# Patient Record
Sex: Female | Born: 1985 | Race: White | Hispanic: No | Marital: Married | Smoking: Never smoker
Health system: Southern US, Community
[De-identification: ages and names within clinical notes are randomized; demographics above are authoritative.]

## PROBLEM LIST (undated history)

## (undated) DIAGNOSIS — K219 Gastro-esophageal reflux disease without esophagitis: Secondary | ICD-10-CM

## (undated) DIAGNOSIS — D649 Anemia, unspecified: Secondary | ICD-10-CM

## (undated) DIAGNOSIS — R51 Headache: Secondary | ICD-10-CM

## (undated) DIAGNOSIS — J45909 Unspecified asthma, uncomplicated: Secondary | ICD-10-CM

## (undated) DIAGNOSIS — M1990 Unspecified osteoarthritis, unspecified site: Secondary | ICD-10-CM

## (undated) DIAGNOSIS — E079 Disorder of thyroid, unspecified: Secondary | ICD-10-CM

## (undated) DIAGNOSIS — Z8489 Family history of other specified conditions: Secondary | ICD-10-CM

## (undated) DIAGNOSIS — R011 Cardiac murmur, unspecified: Secondary | ICD-10-CM

## (undated) DIAGNOSIS — R002 Palpitations: Secondary | ICD-10-CM

## (undated) DIAGNOSIS — M8430XA Stress fracture, unspecified site, initial encounter for fracture: Secondary | ICD-10-CM

## (undated) DIAGNOSIS — Z87442 Personal history of urinary calculi: Secondary | ICD-10-CM

## (undated) HISTORY — PX: OTHER SURGICAL HISTORY: SHX169

## (undated) HISTORY — DX: Headache: R51

## (undated) HISTORY — DX: Anemia, unspecified: D64.9

## (undated) HISTORY — DX: Gastro-esophageal reflux disease without esophagitis: K21.9

## (undated) HISTORY — DX: Disorder of thyroid, unspecified: E07.9

## (undated) HISTORY — PX: ENDOVENOUS ABLATION SAPHENOUS VEIN W/ LASER: SUR449

## (undated) HISTORY — DX: Morbid (severe) obesity due to excess calories: E66.01

## (undated) HISTORY — DX: Palpitations: R00.2

## (undated) HISTORY — DX: Stress fracture, unspecified site, initial encounter for fracture: M84.30XA

---
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
## 2004-02-20 ENCOUNTER — Ambulatory Visit: Payer: Self-pay | Admitting: Internal Medicine

## 2004-02-23 ENCOUNTER — Emergency Department (HOSPITAL_COMMUNITY): Admission: EM | Admit: 2004-02-23 | Discharge: 2004-02-24 | Payer: Self-pay | Admitting: Emergency Medicine

## 2004-02-24 ENCOUNTER — Ambulatory Visit: Payer: Self-pay | Admitting: Internal Medicine

## 2004-05-27 ENCOUNTER — Ambulatory Visit: Payer: Self-pay | Admitting: Internal Medicine

## 2004-11-09 ENCOUNTER — Ambulatory Visit: Payer: Self-pay | Admitting: Internal Medicine

## 2004-12-02 ENCOUNTER — Ambulatory Visit: Payer: Self-pay | Admitting: Internal Medicine

## 2005-03-24 ENCOUNTER — Ambulatory Visit: Payer: Self-pay | Admitting: Internal Medicine

## 2006-06-15 ENCOUNTER — Ambulatory Visit: Payer: Self-pay | Admitting: Internal Medicine

## 2006-10-05 DIAGNOSIS — G43909 Migraine, unspecified, not intractable, without status migrainosus: Secondary | ICD-10-CM | POA: Insufficient documentation

## 2006-12-04 DIAGNOSIS — M26629 Arthralgia of temporomandibular joint, unspecified side: Secondary | ICD-10-CM | POA: Insufficient documentation

## 2006-12-04 DIAGNOSIS — G4733 Obstructive sleep apnea (adult) (pediatric): Secondary | ICD-10-CM | POA: Insufficient documentation

## 2006-12-04 DIAGNOSIS — E669 Obesity, unspecified: Secondary | ICD-10-CM | POA: Insufficient documentation

## 2006-12-05 ENCOUNTER — Ambulatory Visit: Payer: Self-pay | Admitting: Internal Medicine

## 2006-12-12 ENCOUNTER — Encounter: Payer: Self-pay | Admitting: Internal Medicine

## 2006-12-12 ENCOUNTER — Ambulatory Visit: Payer: Self-pay

## 2007-01-16 ENCOUNTER — Ambulatory Visit: Payer: Self-pay | Admitting: Internal Medicine

## 2007-02-05 ENCOUNTER — Telehealth: Payer: Self-pay | Admitting: Internal Medicine

## 2007-04-18 ENCOUNTER — Ambulatory Visit: Payer: Self-pay | Admitting: Internal Medicine

## 2007-04-19 ENCOUNTER — Ambulatory Visit (HOSPITAL_COMMUNITY): Admission: RE | Admit: 2007-04-19 | Discharge: 2007-04-19 | Payer: Self-pay | Admitting: Internal Medicine

## 2007-04-20 ENCOUNTER — Encounter: Payer: Self-pay | Admitting: Internal Medicine

## 2007-04-20 ENCOUNTER — Telehealth: Payer: Self-pay | Admitting: Internal Medicine

## 2007-05-02 ENCOUNTER — Ambulatory Visit: Payer: Self-pay | Admitting: Internal Medicine

## 2007-05-07 ENCOUNTER — Encounter (INDEPENDENT_AMBULATORY_CARE_PROVIDER_SITE_OTHER): Payer: Self-pay | Admitting: *Deleted

## 2007-06-21 ENCOUNTER — Encounter (INDEPENDENT_AMBULATORY_CARE_PROVIDER_SITE_OTHER): Payer: Self-pay | Admitting: *Deleted

## 2007-10-31 ENCOUNTER — Telehealth: Payer: Self-pay | Admitting: Internal Medicine

## 2008-04-17 ENCOUNTER — Ambulatory Visit: Payer: Self-pay | Admitting: Family Medicine

## 2008-04-17 DIAGNOSIS — D509 Iron deficiency anemia, unspecified: Secondary | ICD-10-CM | POA: Insufficient documentation

## 2008-04-17 DIAGNOSIS — K219 Gastro-esophageal reflux disease without esophagitis: Secondary | ICD-10-CM | POA: Insufficient documentation

## 2008-05-20 ENCOUNTER — Telehealth: Payer: Self-pay | Admitting: Family Medicine

## 2008-05-26 ENCOUNTER — Ambulatory Visit: Payer: Self-pay | Admitting: Family Medicine

## 2008-05-26 ENCOUNTER — Emergency Department (HOSPITAL_COMMUNITY): Admission: EM | Admit: 2008-05-26 | Discharge: 2008-05-26 | Payer: Self-pay | Admitting: Emergency Medicine

## 2008-05-28 ENCOUNTER — Encounter: Payer: Self-pay | Admitting: Family Medicine

## 2008-05-28 ENCOUNTER — Ambulatory Visit (HOSPITAL_COMMUNITY): Admission: RE | Admit: 2008-05-28 | Discharge: 2008-05-28 | Payer: Self-pay | Admitting: Emergency Medicine

## 2008-06-02 ENCOUNTER — Telehealth (INDEPENDENT_AMBULATORY_CARE_PROVIDER_SITE_OTHER): Payer: Self-pay | Admitting: Internal Medicine

## 2008-06-03 ENCOUNTER — Encounter (INDEPENDENT_AMBULATORY_CARE_PROVIDER_SITE_OTHER): Payer: Self-pay | Admitting: *Deleted

## 2008-06-03 ENCOUNTER — Ambulatory Visit: Payer: Self-pay | Admitting: Family Medicine

## 2008-06-03 DIAGNOSIS — M542 Cervicalgia: Secondary | ICD-10-CM | POA: Insufficient documentation

## 2008-09-03 ENCOUNTER — Telehealth: Payer: Self-pay | Admitting: Family Medicine

## 2008-09-04 ENCOUNTER — Ambulatory Visit: Payer: Self-pay | Admitting: Family Medicine

## 2008-09-04 LAB — CONVERTED CEMR LAB: Varicella-Zoster Ab, IgM: <0.91 (ref <0.91)

## 2008-09-16 ENCOUNTER — Ambulatory Visit: Payer: Self-pay | Admitting: Family Medicine

## 2008-09-19 ENCOUNTER — Encounter: Admission: RE | Admit: 2008-09-19 | Discharge: 2008-09-19 | Payer: Self-pay | Admitting: Family Medicine

## 2008-09-21 DIAGNOSIS — S82109A Unspecified fracture of upper end of unspecified tibia, initial encounter for closed fracture: Secondary | ICD-10-CM | POA: Insufficient documentation

## 2008-09-23 ENCOUNTER — Encounter (INDEPENDENT_AMBULATORY_CARE_PROVIDER_SITE_OTHER): Payer: Self-pay | Admitting: *Deleted

## 2008-09-24 ENCOUNTER — Encounter: Payer: Self-pay | Admitting: Family Medicine

## 2008-10-05 ENCOUNTER — Ambulatory Visit: Payer: Self-pay | Admitting: Family Medicine

## 2009-01-12 ENCOUNTER — Ambulatory Visit: Payer: Self-pay | Admitting: Family Medicine

## 2009-01-19 ENCOUNTER — Telehealth: Payer: Self-pay | Admitting: Family Medicine

## 2009-01-29 ENCOUNTER — Telehealth: Payer: Self-pay | Admitting: Family Medicine

## 2009-03-09 ENCOUNTER — Telehealth: Payer: Self-pay | Admitting: Family Medicine

## 2009-03-24 ENCOUNTER — Encounter: Payer: Self-pay | Admitting: Cardiovascular Disease

## 2009-03-24 ENCOUNTER — Emergency Department: Payer: Self-pay | Admitting: Emergency Medicine

## 2009-03-24 LAB — CONVERTED CEMR LAB
BUN: 14 mg/dL
CO2: 24 meq/L
Calcium: 9.7 mg/dL
Chloride: 102 meq/L
Creatinine, Ser: 0.83 mg/dL
Glucose, Bld: 80 mg/dL
Potassium: 4.2 meq/L
Sodium: 138 meq/L
TSH: 5.77 microintl units/mL

## 2009-03-25 ENCOUNTER — Ambulatory Visit: Payer: Self-pay | Admitting: Cardiovascular Disease

## 2009-03-27 ENCOUNTER — Ambulatory Visit: Payer: Self-pay | Admitting: Family Medicine

## 2009-03-27 DIAGNOSIS — I479 Paroxysmal tachycardia, unspecified: Secondary | ICD-10-CM | POA: Insufficient documentation

## 2009-03-27 DIAGNOSIS — F411 Generalized anxiety disorder: Secondary | ICD-10-CM | POA: Insufficient documentation

## 2009-07-06 ENCOUNTER — Telehealth: Payer: Self-pay | Admitting: Family Medicine

## 2009-07-16 ENCOUNTER — Encounter: Payer: Self-pay | Admitting: Family Medicine

## 2009-08-14 ENCOUNTER — Ambulatory Visit: Payer: Self-pay | Admitting: Gastroenterology

## 2009-08-14 ENCOUNTER — Encounter (INDEPENDENT_AMBULATORY_CARE_PROVIDER_SITE_OTHER): Payer: Self-pay | Admitting: *Deleted

## 2009-08-14 DIAGNOSIS — R12 Heartburn: Secondary | ICD-10-CM | POA: Insufficient documentation

## 2009-08-21 ENCOUNTER — Ambulatory Visit: Payer: Self-pay | Admitting: Gastroenterology

## 2009-09-11 ENCOUNTER — Telehealth: Payer: Self-pay | Admitting: Gastroenterology

## 2010-01-06 ENCOUNTER — Ambulatory Visit: Payer: Self-pay | Admitting: Gastroenterology

## 2010-01-06 DIAGNOSIS — R1011 Right upper quadrant pain: Secondary | ICD-10-CM | POA: Insufficient documentation

## 2010-01-13 ENCOUNTER — Ambulatory Visit (HOSPITAL_COMMUNITY)
Admission: RE | Admit: 2010-01-13 | Discharge: 2010-01-13 | Payer: Self-pay | Source: Home / Self Care | Attending: Gastroenterology | Admitting: Gastroenterology

## 2010-02-02 ENCOUNTER — Ambulatory Visit (HOSPITAL_COMMUNITY)
Admission: RE | Admit: 2010-02-02 | Discharge: 2010-02-02 | Payer: Self-pay | Source: Home / Self Care | Attending: Gastroenterology | Admitting: Gastroenterology

## 2010-02-08 ENCOUNTER — Encounter: Payer: Self-pay | Admitting: Emergency Medicine

## 2010-02-14 LAB — CONVERTED CEMR LAB
BUN: 10 mg/dL (ref 6–23)
CO2: 25 meq/L (ref 19–32)
Calcium: 10 mg/dL (ref 8.4–10.5)
Chloride: 105 meq/L (ref 96–112)
Creatinine, Ser: 0.8 mg/dL (ref 0.4–1.2)
Free T4: 0.9 ng/dL (ref 0.6–1.6)
GFR calc Af Amer: 115 mL/min
GFR calc non Af Amer: 95 mL/min
Glucose, Bld: 102 mg/dL — ABNORMAL HIGH (ref 70–99)
Potassium: 3.9 meq/L (ref 3.5–5.1)
Sodium: 140 meq/L (ref 135–145)
T3, Free: 3 pg/mL (ref 2.3–4.2)
TSH: 1.84 microintl units/mL (ref 0.35–5.50)

## 2010-02-16 NOTE — Progress Notes (Signed)
  Phone Note Call from Patient   Caller: Patient Call For: Hannah Beat MD Summary of Call: Patient seen and need antibiotic and now has yeast infection wants something call in Initial call taken by: Benny Lennert CMA Duncan Dull),  January 19, 2009 12:49 PM  Follow-up for Phone Call        Likely yeast infection post antibiotics. Sent in antifungal.  Was this the coirrect pharm? if not correct.  Follow-up by: Kerby Nora MD,  January 19, 2009 1:15 PM    New/Updated Medications: FLUCONAZOLE 150 MG TABS (FLUCONAZOLE) 1 tab by mouth x 1 Prescriptions: FLUCONAZOLE 150 MG TABS (FLUCONAZOLE) 1 tab by mouth x 1  #1 x 0   Entered and Authorized by:   Kerby Nora MD   Signed by:   Kerby Nora MD on 01/19/2009   Method used:   [facility]        [facility] (retail)       [address]       [place], [place]  [address]       Ph: [phone] or [phone]       Fax: [phone]   RxID:   [id number]

## 2010-02-16 NOTE — Progress Notes (Signed)
Summary: wants GI referral  Phone Note Call from Patient   Caller: [name] [name]   [phone] Call For: Hannah Beat MD Summary of Call: Pt is asking for a referral to a GI.  Has had some problems with reflux, getting worse.  No preference where. Initial call taken by: Lowella Petties CMA,  July 06, 2009 11:54 AM  Follow-up for Phone Call        failure multiple ppi's  reasonbale Follow-up by: Hannah Beat MD,  July 06, 2009 12:10 PM

## 2010-02-16 NOTE — Assessment & Plan Note (Signed)
Summary: EMERGENCY ROOM F-UP ON RAPID HEART BEAT   Vital Signs:  Patient profile:   25 year old female Height:      68.75 inches Weight:      268 pounds BMI:     40.01 Temp:     98.3 degrees F oral Pulse rate:   80 / minute Pulse rhythm:   regular BP sitting:   110 / 76  (left arm) Cuff size:   large  Vitals Entered By: Linde Gillis CMA Duncan Dull) (March 27, 2009 3:22 PM) CC: ER follow up rapid heartbeat   History of Present Illness: 25 year old f/u rapid heart rate:  Tuesday, had some rapid heart, would go and skip some beats and going back and forth.    Dr. Windell Hummingbird notes and ER notes reviewed. EKG normal, s/p 48 hour holter 2 years ago that was normal.  She had symptoms lasting almost 4 hours.  Discussing with mom she has had similar sensations like this for a number of years. Some stress at work.   Currently has diazepam to start if sensations resume   Current Problems (verified): 1)  Paroxysmal Tachycardia  (ICD-427.2) 2)  Anxiety State, Unspecified  (ICD-300.00) 3)  Obstructive Sleep Apnea  (ICD-327.23) 4)  Fracture, Tibial Plateau  (ICD-823.00) 5)  Cervicalgia  (ICD-723.1) 6)  Gerd  (ICD-530.81) 7)  Anemia-iron Deficiency  (ICD-280.9) 8)  Sleep Apnea, Obstructive, Mild  (ICD-327.23) 9)  Obesity  (ICD-278.00) 10)  Migraine Headache  (ICD-346.90) 11)  Hx of Temporomandibular Joint Pain  (ICD-524.62)  Allergies (verified): No Known Drug Allergies  Past History:  Past medical, surgical, family and social histories (including risk factors) reviewed, and no changes noted (except as noted below).  Past Medical History: Reviewed history from 09/21/2008 and no changes required. Headache Morbid Obesity Palpitations Anemia-iron deficiency GERD Medial tibial plateau stress fracture, mild medial retinacular sprain and PF lig sprain  Past Surgical History: Reviewed history from 12/04/2006 and no changes required. Status post multiple tubes in ears  Family  History: Reviewed history from 04/17/2008 and no changes required. Alcoholism, Drug Addiction: 0 Colon CA: 0 Ovarian/Uterine CA: 0 Breast CA: 0 Lung CA: 0 Prostate CA: 0 CAD: 0 CVA: other Sudden death < 50: 0 DM: other Mental Illness: 0  Family History Hypertension  Social History: Reviewed history from 04/17/2008 and no changes required. Never Smoked Alcohol use-no Getting married, 07/2008 Occupation:  Works as a Pharmacologist at [employer], [employer]  Review of [employer]:  Denies chills, fatigue, and fever. CV:  Complains of palpitations; denies swelling of feet and swelling of hands. Psych:  stress and anxiety?.  Physical Exam  Additional Exam:  GEN: WDWN, NAD, Non-toxic, A & O x 3 HEENT: Atraumatic, Normocephalic. Neck supple. No masses, No LAD. Ears and Nose: No external deformity. CV: RRR, No M/G/R. No JVD. No thrill. No extra heart sounds. PULM: CTA B, no wheezes, crackles, rhonchi. No retractions. No resp. distress. No accessory muscle use. EXTR: No c/c/e NEURO: Normal gait.  PSYCH: Normally interactive. Conversant. Not depressed or anxious appearing.  Calm demeanor.     Impression & Recommendations:  Problem # 1:  TACHYCARDIA (ICD-785) agree with Dr. Mariah Milling, if any more continued symptoms, [facility] is a good idea.  Problem # 2:  ANXIETY STATE, UNSPECIFIED (ICD-300.00) I wonder if some of these short spells may have an anxiety component - asked her to put ativan under tongue at onset of shorter episode to see if relieves symptoms  Her  updated medication list for this problem includes:    Lorazepam 0.5 Mg Tabs (Lorazepam) .Marland Kitchen... 1 by mouth as directed. as needed for anxiety  Problem # 3:  OBSTRUCTIVE SLEEP APNEA (ICD-327.23) the suggestion for CPAP and sleep consult a good one - patient would need to be willing  Complete Medication List: 1)  Flexeril 10 Mg Tabs (Cyclobenzaprine hcl) .Marland Kitchen.. 1 tab one by mouth three times a day as needed 2)  Ibuprofen  600 Mg Tabs (Ibuprofen) .... One tablet by mouth every 6 to 8 hours as needed headache 3)  Aleve 220 Mg Tabs (Naproxen sodium) .... Take 1 tablet by mouth once a day 4)  Protonix 40 Mg Tbec (Pantoprazole sodium) .Marland Kitchen.. 1 by mouth two times a day (failure prilosec, multiple ppi's) 5)  Vitamin D 1000 Unit Tabs (Cholecalciferol) .Marland Kitchen.. 1 by mouth once daily 6)  Tri-sprintec 0.18/0.215/0.25 Mg-35 Mcg Tabs (Norgestim-eth estrad triphasic) .Marland Kitchen.. 1 by mouth once daily 7)  Cardizem 30 Mg Tabs (Diltiazem hcl) .Marland Kitchen.. 1 tab two times a day as needed for increased heart rate 8)  Lorazepam 0.5 Mg Tabs (Lorazepam) .Marland Kitchen.. 1 by mouth as directed. as needed for anxiety Prescriptions: LORAZEPAM 0.5 MG TABS (LORAZEPAM) 1 by mouth as directed. as needed for anxiety  #10 x 0   Entered and Authorized by:   Hannah Beat MD   Signed by:   Hannah Beat MD on 03/27/2009   Method used:   Print then Give to Patient   RxID:   [id number]   Current Allergies (reviewed today): No known allergies

## 2010-02-16 NOTE — Procedures (Signed)
Summary: Upper Endoscopy  Patient: [name] [name] Note: All result statuses are Final unless otherwise noted.  Tests: (1) Upper Endoscopy (EGD)   EGD Upper Endoscopy       DONE     [facility]     [address]     [place], [place]  [address]           ENDOSCOPY PROCEDURE REPORT           PATIENT:  [name], [name]  MR#:  [id number]     BIRTHDATE:  21-May-1985, 23 yrs. old  GENDER:  female     ENDOSCOPIST:  Rachael Fee, MD     Referred by:  Elpidio Galea. Copland, M.D.     PROCEDURE DATE:  08/21/2009     PROCEDURE:  EGD, diagnostic     ASA CLASS:  Class II     INDICATIONS:  GERD     MEDICATIONS:  Fentanyl 50 mcg IV, Versed 7 mg IV     TOPICAL ANESTHETIC:  Exactacain Spray           DESCRIPTION OF PROCEDURE:   After the risks benefits and     alternatives of the procedure were thoroughly explained, informed     consent was obtained.  The LB GIF-H180 [id number] endoscope was     introduced through the mouth and advanced to the second portion of     the duodenum, without limitations.  The instrument was slowly     withdrawn as the mucosa was fully examined.     <<PROCEDUREIMAGES>>     There was very mild reflux esophagitis above a small (1cm) hiatal     hernia. The examination was otherwise normal (see image5, image4,     image2, and image1).    Retroflexed views revealed no     abnormalities.    The scope was then withdrawn from the patient     and the procedure completed.           COMPLICATIONS:  None           ENDOSCOPIC IMPRESSION:     1) Very mild reflux esophagitis.  Small hiatal hernia will     predispose to GERD.           RECOMMENDATIONS:     Continue on protonix once daily (best taken 20-30 min prior to a     meal).           ______________________________     Rachael Fee, MD           n.     eSIGNED:   Rachael Fee at 08/21/2009 04:09 PM           [name] [name], [phone]  Note: An exclamation mark (!) indicates a result that was not  dispersed into the flowsheet. Document Creation Date: 08/21/2009 4:11 PM _______________________________________________________________________  (1) Order result status: Final Collection or observation date-time: 08/21/2009 16:06 Requested date-time:  Receipt date-time:  Reported date-time:  Referring Physician:   Ordering Physician: Rob Bunting [phone]) Specimen Source:  Source: Launa Grill Order Number: [id number] Lab site:

## 2010-02-16 NOTE — Progress Notes (Signed)
Summary: PHI  PHI   Imported By: Harlon Flor 03/26/2009 10:09:39  _____________________________________________________________________  External Attachment:    Type:   Image     Comment:   External Document

## 2010-02-16 NOTE — Assessment & Plan Note (Signed)
History of Present Illness Visit Type: Initial Consult Primary GI MD: Rob Bunting MD Primary Provider: Hannah Beat, MD Chief Complaint: GERD History of Present Illness:      very pleasant 25 year old woman who is here with her husband today.  who has had pyrosis, she will vomit at least once a week.  Pyrosis 3-4 times a week.  This has been going on for 10 years.  She had barium UGI, pH testing 10 years ago.    She has tried prevacid, protonix...has not tried nexium.  She will take the PPI in AM, then eats 2 hours later.  Was drinking 3-4 sodas a day, has cut back.  She normally eats dinner 4-5 hours before laying down. Symptoms clearly worse at night.  Sometimes she has solid food sdysphagia.  She has gained 20 pounds in the past  year or so.  she will take ibuprofen as needed for migraines.  Takes one alleve a day in AM to help prevent HAs.           Current Medications (verified): 1)  Flexeril 10 Mg  Tabs (Cyclobenzaprine Hcl) .Marland Kitchen.. 1 Tab One By Mouth Three Times A Day As Needed 2)  Ibuprofen 600 Mg  Tabs (Ibuprofen) .... One Tablet By Mouth Every 6 To 8 Hours As Needed Headache 3)  Aleve 220 Mg Tabs (Naproxen Sodium) .... Take 1 Tablet By Mouth Once A Day 4)  Protonix 40 Mg  Tbec (Pantoprazole Sodium) .Marland Kitchen.. 1 By Mouth Two Times A Day (Failure Prilosec, Multiple Ppi's) 5)  Vitamin D 1000 Unit Tabs (Cholecalciferol) .... 2  By Mouth Once Daily 6)  Birth Control .Marland Kitchen.. 1 By Mouth Once Daily 7)  Cardizem 30 Mg Tabs (Diltiazem Hcl) .Marland Kitchen.. 1 Tab Two Times A Day As Needed For Increased Heart Rate 8)  Lorazepam 0.5 Mg Tabs (Lorazepam) .Marland Kitchen.. 1 By Mouth As Directed. As Needed For Anxiety  Allergies (verified): No Known Drug Allergies  Past History:  Past Medical History: Headache Morbid Obesity Palpitations Anemia-iron deficiency GERD Medial tibial plateau stress fracture, mild medial retinacular sprain and PF lig sprain   Past Surgical History: Status post multiple  tubes in ears   Family History: Alcoholism, Drug Addiction: 0 Colon CA: 0 Ovarian/Uterine CA: 0 Breast CA: 0 Lung CA: 0 Prostate CA: 0 CAD: 0 CVA: other Sudden death < 50: 0 DM: other Mental Illness: 0  Family History Hypertension   Social History: Never Smoked Alcohol use-no married Occupation:  Works as a Pharmacologist at [employer], [employer]  Review of Systems       Pertinent positive and negative review of systems were noted in the above HPI and GI specific review of systems.  All other review of systems was otherwise negative.   Vital Signs:  Patient profile:   25 year old female Height:      69 inches Weight:      277.38 pounds BMI:     41.11 Pulse rate:   100 / minute Pulse rhythm:   regular BP sitting:   128 / 90  (left arm) Cuff size:   large  Vitals Entered By: June McMurray CMA Duncan Dull) (August 14, 2009 1:55 PM)  Physical Exam  Additional Exam:  Constitutional: Morbidly obese, otherwise generally well-appearing Psychiatric: alert and oriented times 3 Eyes: extraocular movements intact Mouth: oropharynx moist, no lesions Neck: supple, no lymphadenopathy Cardiovascular: heart regular rate and rythm Lungs: CTA bilaterally Abdomen: soft, non-tender, non-distended, no obvious ascites, no peritoneal signs, normal bowel  sounds Extremities: no lower extremity edema bilaterally Skin: no lesions on visible extremities    Impression & Recommendations:  Problem # 1:  chronic GERD she has had pyrosis for at least 8-10 years. I think we should proceed with EGD at her soonest convenience to screen her for Barrett's esophagus, stricturing, esophagitis. She is taking proton pump inhibitor at the wrong time in relation to eating and so I have recommended she change that. We did discuss that her weight could be contributing to GERD and I recommended she focus on a long-term, very gradual weight reduction with a goal of losing 10-12 pounds per year for the next several  years.  Patient Instructions: 1)  GERD handout given. 2)  You will be scheduled to have an upper endoscopy. 3)  You should change the way you are taking your antiacid medicine (protonix) so that you are taking it 20-30 minutes prior to a decent first and last  meals of the day as that is the way the pill is designed to work most effectively. 4)  Gradual weight loss. 5)  A copy of this information will be sent to Dr. Dallas Schimke. 6)  The medication list was reviewed and reconciled.  All changed / newly prescribed medications were explained.  A complete medication list was provided to the patient / caregiver.  Appended Document: Orders Update/EGD    Clinical Lists Changes  Problems: Added new problem of HEARTBURN (ICD-787.1) Orders: Added new Test order of EGD (EGD) - Signed

## 2010-02-16 NOTE — Letter (Signed)
Summary: EGD Instructions  [facility]  [address] [place], [place] [address]   Phone: [phone]  Fax: [phone]       [name] [name]    12/24/1985    MRN: [id number]       Procedure Day /Date: 08/21/09  THURS     Arrival Time: 3 pm     Procedure Time:4 pm     Location of Procedure:                     [facility] (4th Floor)    PREPARATION FOR ENDOSCOPY   On 08/21/09  THE DAY OF THE PROCEDURE:  1.   No solid foods, milk or milk products are allowed after midnight the night before your procedure.  2.   Do not drink anything colored red or purple.  Avoid juices with pulp.  No orange juice.  3.  You may drink clear liquids until 2 pm, which is 2 hours before your procedure.                                                                                                CLEAR LIQUIDS INCLUDE: Water Jello Ice Popsicles Tea (sugar ok, no milk/cream) Powdered fruit flavored drinks Coffee (sugar ok, no milk/cream) Gatorade Juice: apple, white grape, white cranberry  Lemonade Clear bullion, consomm, broth Carbonated beverages (any kind) Strained chicken noodle soup Hard Candy   MEDICATION INSTRUCTIONS  Unless otherwise instructed, you should take regular prescription medications with a small sip of water as early as possible the morning of your procedure.  Diabetic patients - see separate instructions.             OTHER INSTRUCTIONS  You will need a responsible adult at least 26 years of age to accompany you and drive you home.   This person must remain in the waiting room during your procedure.  Wear loose fitting clothing that is easily removed.  Leave jewelry and other valuables at home.  However, you may wish to bring a book to read or an iPod/MP3 player to listen to music as you wait for your procedure to start.  Remove all body piercing jewelry and leave at home.  Total time from sign-in until discharge is  approximately 2-3 hours.  You should go home directly after your procedure and rest.  You can resume normal activities the day after your procedure.  The day of your procedure you should not:   Drive   Make legal decisions   Operate machinery   Drink alcohol   Return to work  You will receive specific instructions about eating, activities and medications before you leave.    The above instructions have been reviewed and explained to me by   _______________________    I fully understand and can verbalize these instructions _____________________________ Date _________     Appended Document: EGD Instructions instructions corrected should be Fri not Thurs

## 2010-02-16 NOTE — Progress Notes (Signed)
Summary: Rx Ibuprofen  Phone Note Refill Request Call back at [phone] Message [facility]:  [facility] on January 29, 2009 3:26 PM  Refills Requested: Medication #1:  IBUPROFEN 600 MG  TABS one tablet by mouth every 6 to 8 hours as needed headache   Last Refilled: 07/13/2007 Received faxed refill request.  Please advise   Method Requested: Telephone to Pharmacy Initial call taken by: Linde Gillis CMA Duncan Dull),  January 29, 2009 3:27 PM    Prescriptions: IBUPROFEN 600 MG  TABS (IBUPROFEN) one tablet by mouth every 6 to 8 hours as needed headache  #40 x 5   Entered by:   Benny Lennert CMA (AAMA)   Authorized by:   Hannah Beat MD   Signed by:   Benny Lennert CMA (AAMA) on 01/29/2009   Method used:   [facility]        [facility]       [address]       [place], [place]  [address]       Ph: [phone] or [phone]       Fax: [phone]   RxID:   [id number]

## 2010-02-16 NOTE — Assessment & Plan Note (Signed)
Summary: NP6/AMD  Medications Added VITAMIN D 1000 UNIT TABS (CHOLECALCIFEROL) 1 by mouth once daily TRI-SPRINTEC 0.18/0.215/0.25 MG-35 MCG TABS (NORGESTIM-ETH ESTRAD TRIPHASIC) 1 by mouth once daily CARDIZEM 30 MG TABS (DILTIAZEM HCL) 1 tab two times a day as needed for increased heart rate      Allergies Added: NKDA  Visit Type:  New  [facility] Referring Provider:  ER doc Primary Provider:  Hannah Beat MD  CC:  chest pain, ct scan clear, irregular heart beat, and shortness of breath. no edema...  History of Present Illness: very pleasant 25 year old woman, with history of obesity, possible obstructive sleep apnea, GERD, migraine headaches who presents after recent episode of palpitations.  [name] [name] states that on March 8 at 7 PM she developed a very rapid heartbeat. His continued for 3 hours before it resolved. Over the course of the night, she had palpitations though they were not fast. She went to the emergency room in the morning and had blood work and a CT scan for an elevated d-dimer. The CT scan showed no pulmonary embolism, TSH 5.77, negative cardiac enzymes, negative tox screen, normal basic metabolic panel.  She states having a similar episode 2 years ago that resolved without intervention. When she had this episode, she had difficulty breathing.  She also has difficulty breathing if she lays flat on her back though her breathing improves if she lies on her side. Her mother states that she has significant snoring and she had a sleep study 4 years ago that seemed to confirm sleep apnea.  Preventive Screening-Counseling & Management  Alcohol-Tobacco     Alcohol drinks/day: 0     Smoking Status: never  Caffeine-Diet-Exercise     Caffeine use/day: 1 cup     Does Patient Exercise: no  Current Problems (verified): 1)  Vertigo  (ICD-780.4) 2)  Otitis Media, Acute, Right  (ICD-382.9) 3)  Fracture, Tibial Plateau  (ICD-823.00) 4)  Cervicalgia  (ICD-723.1) 5)  Gerd   (ICD-530.81) 6)  Anemia-iron Deficiency  (ICD-280.9) 7)  Headache  (ICD-784.0) 8)  Palpitations  (ICD-785.1) 9)  Sleep Apnea, Obstructive, Mild  (ICD-327.23) 10)  Obesity  (ICD-278.00) 11)  Migraine Headache  (ICD-346.90) 12)  Hx of Temporomandibular Joint Pain  (ICD-524.62)  Current Medications (verified): 1)  Flexeril 10 Mg  Tabs (Cyclobenzaprine Hcl) .Marland Kitchen.. 1 Tab One By Mouth Three Times A Day As Needed 2)  Ibuprofen 600 Mg  Tabs (Ibuprofen) .... One Tablet By Mouth Every 6 To 8 Hours As Needed Headache 3)  Aleve 220 Mg Tabs (Naproxen Sodium) .... Take 1 Tablet By Mouth Once A Day 4)  Protonix 40 Mg  Tbec (Pantoprazole Sodium) .Marland Kitchen.. 1 By Mouth Two Times A Day (Failure Prilosec, Multiple Ppi's) 5)  Vitamin D 1000 Unit Tabs (Cholecalciferol) .Marland Kitchen.. 1 By Mouth Once Daily 6)  Tri-Sprintec 0.18/0.215/0.25 Mg-35 Mcg Tabs (Norgestim-Eth Estrad Triphasic) .Marland Kitchen.. 1 By Mouth Once Daily  Allergies (verified): No Known Drug Allergies  Past History:  Past Medical History: Last updated: 09/21/2008 Headache Morbid Obesity Palpitations Anemia-iron deficiency GERD Medial tibial plateau stress fracture, mild medial retinacular sprain and PF lig sprain  Past Surgical History: Last updated: 12/04/2006 Status post multiple tubes in ears  Family History: Last updated: 04/17/2008 Alcoholism, Drug Addiction: 0 Colon CA: 0 Ovarian/Uterine CA: 0 Breast CA: 0 Lung CA: 0 Prostate CA: 0 CAD: 0 CVA: other Sudden death < 50: 0 DM: other Mental Illness: 0  Family History Hypertension  Social History: Last updated: 04/17/2008 Never Smoked Alcohol  use-no Getting married, 07/2008 Occupation:  Works as a Pharmacologist at [employer], [employer]  Risk Factors: Alcohol Use: 0 (03/25/2009) Caffeine Use: 1 cup (03/25/2009) Exercise: no (03/25/2009)  Risk Factors: Smoking Status: never (03/25/2009)  Social History: Alcohol drinks/day:  0 Caffeine use/day:  1 cup Does Patient Exercise:  no  Review  of Systems  The patient denies anorexia, fever, vision loss, decreased hearing, hoarseness, chest pain, syncope, dyspnea on exertion, peripheral edema, prolonged cough, abdominal pain, incontinence, muscle weakness, difficulty walking, depression, and enlarged lymph nodes.         Tachypalpitations, tacycardia, SOB when sleeping on her back  Vital Signs:  Patient profile:   25 year old female Height:      68.75 inches Weight:      271.25 pounds BMI:     40.49 Pulse rate:   78 / minute Pulse rhythm:   regular BP sitting:   128 / 90  (left arm) Cuff size:   large  Vitals Entered By: [name] [name] (March 25, 2009 11:34 AM)  Physical Exam  General:  obese young in no apparent distress, neck is supple with no JVP or carotid bruits, heart sounds are regular with normal S1-S2 and no murmurs appreciated, lungs are clear to auscultation with no wheezes or rales, abdominal exam is benign, no significant lower extremity edema, neurologic exam is grossly nonfocal, skin is warm and dry, pulses are equal in her upper and lower extremities.    EKG  Procedure date:  03/25/2009  Findings:      normal sinus rhythm with rate 78 beats per minute, no significant ST or T wave changes.  Impression & Recommendations:  Problem # 1:  TACHYCARDIA (ICD-785) 3 hours of tachycardia from 7 PM to 10 PM on March 8. As this was rapid, I'm concerned about SVT, possibly even atrial fibrillation. She states that it was very regular, more consistent with SVT. She had an episode 2 years ago as well. I do not believe that a Holter monitor could identify the rhythm as it happens so infrequently. I have suggested to her that if she starts having more episodes, she should contact our office for an event monitor. We would run this for one month.  I have written a prescription for diltiazem 30 mg to be taken as needed also with repeat if indicated for tachypalpitations. I have taught her various maneuvers such as the  Valsalva maneuver and carotid sinus massage which will might help to possibly break her rhythm if it does occur again. Again have asked her to contact our office if she starts having more episodes for further evaluation. I believe that she can be treated with a "pill in the pocket" approach for now.  Problem # 2:  OBSTRUCTIVE SLEEP APNEA (ICD-327.23) by report, she does have a history of obstructive sleep apnea and this is likely the cause of her shortness of breath when she lays flat on her back at nighttime though feels better when she is on her side. She does wake frequently to go to the bathroom and this may also be due to sleep apnea. She does not have CPAP and does not believe that she needs one and she feels rested during the daytime. She does have significant weight and I have asked her to watch her diet and to decrease her weight in an effort to improve her sleep hygiene.  Patient Instructions: 1)  Your physician recommends that you schedule a follow-up appointment as needed. 2)  Your physician has recommended you make the following change in your medication: diltiazem 30 mg twice a day as needed for increased heart rate Prescriptions: CARDIZEM 30 MG TABS (DILTIAZEM HCL) 1 tab two times a day as needed for increased heart rate  #60 x 6   Entered by:   Charlena Cross, RN, BSN   Authorized by:   Dossie Arbour MD   Signed by:   Charlena Cross, RN, BSN on 03/25/2009   Method used:   [facility]        [facility]       [address]       [place], [place]  [address]       Ph: [phone] or [phone]       Fax: [phone]   RxID:   [id number]

## 2010-02-16 NOTE — Progress Notes (Signed)
Summary: Medicine is not helping   Phone Note Call from Patient Call back at [facility] Phone [phone]   Caller: Mom- [phone] Ann Call For: Dr Christella Hartigan Reason for Call: Talk to Nurse Summary of Call: Medicine is just not working still vomitting after meals. Initial call taken by: Leanor Kail [facility],  September 11, 2009 1:40 PM  Follow-up for Phone Call        left message on machine to call back I advised the mother that I would have to speak directly to her daughter.  Chales Abrahams CMA Duncan Dull)  September 11, 2009 2:09 PM   left message on machine to call back I left a message that if she still needs to speak with someone to call  Follow-up by: Chales Abrahams CMA Duncan Dull),  September 14, 2009 8:45 AM

## 2010-02-16 NOTE — Letter (Signed)
[facility]   Im[facility] By: Lanelle Bal 07/29/2009 13:43:58  _____________________________________________________________________  External Attachment:    Type:   Image     Comment:   External Document

## 2010-02-16 NOTE — Progress Notes (Signed)
Summary: refill request for flexeril  Phone Note Refill Request Message from:  Fax from Pharmacy  Refills Requested: Medication #1:  FLEXERIL 10 MG  TABS 1 tab one by mouth three times a day as needed   Last Refilled: 12/06/2008 [facility] [address]  Initial call taken by: Lowella Petties CMA,  March 09, 2009 12:03 PM    Prescriptions: FLEXERIL 10 MG  TABS (CYCLOBENZAPRINE HCL) 1 tab one by mouth three times a day as needed  #40 x 2   Entered and Authorized by:   Hannah Beat MD   Signed by:   Hannah Beat MD on 03/09/2009   Method used:   [facility]        [facility] [facility]       [address]       [place], [place]  [address]       Ph: [phone] or [phone]       Fax: [phone]   RxID:   [id number]

## 2010-02-18 NOTE — Assessment & Plan Note (Signed)
  Review of gastrointestinal problems: 1. GERD, EGD August, 2011 showed minor reflux esophagitis and a small hiatal hernia.   History of Present Illness Visit Type: Follow-up Visit Primary GI MD: Rob Bunting MD Primary Provider: Hannah Beat, MD Chief Complaint: follow-up EGD/RUQ pain  History of Present Illness:     very pleasant 25 year old woman whom I last saw a few months ago at the time of her upper endoscopy. She is here with her husband today.  who has been having daily RUQ pains, intermittent, sharp can last 2-3 min.  Sometimes associated with nausea.THe pain can radiate to right back.  usually pain is post prandial.   protonix two times a day, helps with acid symptoms usually.  she has gained 6 pounds since her last visitAccording to our scale           Current Medications (verified): 1)  Flexeril 10 Mg  Tabs (Cyclobenzaprine Hcl) .Marland Kitchen.. 1 Tab One By Mouth Three Times A Day As Needed 2)  Ibuprofen 600 Mg  Tabs (Ibuprofen) .... One Tablet By Mouth Every 6 To 8 Hours As Needed Headache 3)  Aleve 220 Mg Tabs (Naproxen Sodium) .... Take 1 Tablet By Mouth Once A Day 4)  Protonix 40 Mg  Tbec (Pantoprazole Sodium) .Marland Kitchen.. 1 By Mouth Two Times A Day (Failure Prilosec, Multiple Ppi's) 5)  Vitamin D 1000 Unit Tabs (Cholecalciferol) .... 2  By Mouth Once Daily 6)  Birth Control .Marland Kitchen.. 1 By Mouth Once Daily 7)  Cardizem 30 Mg Tabs (Diltiazem Hcl) .Marland Kitchen.. 1 Tab Two Times A Day As Needed For Increased Heart Rate 8)  Lorazepam 0.5 Mg Tabs (Lorazepam) .Marland Kitchen.. 1 By Mouth As Directed. As Needed For Anxiety  Allergies (verified): No Known Drug Allergies  Vital Signs:  Patient profile:   25 year old female Height:      69 inches Weight:      283 pounds BMI:     41.94 Pulse rate:   68 / minute Pulse rhythm:   regular BP sitting:   128 / 78  (left arm)  Vitals Entered By: Milford Cage NCMA (January 06, 2010 3:52 PM)  Physical Exam  Additional Exam:  Constitutional: obese, otherwise  generally well appearing Psychiatric: alert and oriented times 3 Abdomen: soft, non-tender, non-distended, normal bowel sounds    Impression & Recommendations:  Problem # 1:  GERD her symptoms seem to be under tolerable control with Protonix twice daily. She will continue this for now.  Problem # 2:  intermittent right upper quadrant pains these sound biliary in nature. We will set her up with abdominal ultrasound and if this is normal would proceed with a HIDA scan to evaluate her gallbladder ejection fraction.  Patient Instructions: 1)  You will be scheduled for an ultrasound to examine your gallbladder. 2)  The medication list was reviewed and reconciled.  All changed / newly prescribed medications were explained.  A complete medication list was provided to the patient / caregiver.  Appended Document: Orders Update/US    Clinical Lists Changes  Problems: Added new problem of RUQ PAIN (ICD-789.01) Orders: Added new Test order of Ultrasound Abdomen (UAS) - Signed

## 2010-03-04 ENCOUNTER — Encounter: Payer: Self-pay | Admitting: Family Medicine

## 2010-03-16 NOTE — Medication Information (Signed)
Summary: Letter Regarding Pantoprazole & GERD/[facility]  Letter Regarding Pantoprazole & GERD/[facility]   Imported By: Lanelle Bal 03/11/2010 14:15:05  _____________________________________________________________________  External Attachment:    Type:   Image     Comment:   External Document

## 2010-06-01 ENCOUNTER — Other Ambulatory Visit: Payer: Self-pay | Admitting: Family Medicine

## 2010-06-04 NOTE — Procedures (Signed)
NAME:  Amber [name], Amber [name] NO.:  [id number]   MEDICAL RECORD NO.:  [id number]          PATIENT TYPE:  OUT   LOCATION:  SLEEP CENTER                 FACILITY[facility]  [facility]   PHYSICIAN:  Marcelyn Bruins, M.D. [facility] DATE OF BIRTH:  1985/06/05   DATE OF STUDY:  12/03/2003                              NOCTURNAL POLYSOMNOGRAM   REFERRING PHYSICIAN:  Dr. Thomos Lemons   INDICATION FOR STUDY:  Hypersomnia with sleep apnea.   EPWORTH SLEEPINESS SCORE:  16.   SLEEP ARCHITECTURE:  The patient had a total sleep time of 347 minutes with  a sleep efficiency of 91%.  There was decreased REM and slow age sleep was  never achieved.  Sleep onset latency was normal and REM latency was  prolonged.   IMPRESSION:  1.  Mild obstructive sleep apnea/hypopnea syndrome with a respiratory      disturbance index of 17 events per hour and desaturation as low at 87%.      The events primarily occurred in the supine position.  2. Moderate      snoring noted throughout the study.  3. No clinically significant      cardiac arrhythmias or periodic leg movements.      KC/MEDQ  D:  12/16/2003 16:52:39  T:  12/17/2003 08:05:59  Job:  [id number]

## 2010-06-18 ENCOUNTER — Other Ambulatory Visit: Payer: Self-pay | Admitting: Family Medicine

## 2010-06-18 NOTE — Telephone Encounter (Signed)
Last refill: 02/2010 Last office visit: 03/28/2010  Will refill once, no additional refills as this is not my pt.

## 2010-06-20 NOTE — Telephone Encounter (Signed)
Patient known well. Very reasonable.

## 2010-08-02 ENCOUNTER — Encounter: Payer: Self-pay | Admitting: Cardiovascular Disease

## 2010-11-01 ENCOUNTER — Other Ambulatory Visit: Payer: Self-pay | Admitting: Family Medicine

## 2010-12-29 ENCOUNTER — Other Ambulatory Visit: Payer: Self-pay | Admitting: Family Medicine

## 2011-02-24 ENCOUNTER — Other Ambulatory Visit: Payer: Self-pay | Admitting: Family Medicine

## 2011-09-20 ENCOUNTER — Other Ambulatory Visit: Payer: Self-pay | Admitting: Family Medicine

## 2012-02-29 ENCOUNTER — Ambulatory Visit (INDEPENDENT_AMBULATORY_CARE_PROVIDER_SITE_OTHER): Payer: Self-pay | Admitting: Family Medicine

## 2012-02-29 ENCOUNTER — Encounter: Payer: Self-pay | Admitting: Family Medicine

## 2012-02-29 VITALS — BP 130/84 | HR 78 | Temp 97.7°F | Ht 69.0 in | Wt 244.0 lb

## 2012-02-29 DIAGNOSIS — M549 Dorsalgia, unspecified: Secondary | ICD-10-CM

## 2012-02-29 LAB — POCT URINE PREGNANCY: Preg Test, Ur: NEGATIVE

## 2012-02-29 MED ORDER — TRAMADOL HCL 50 MG PO TABS: 50.0000 mg | ORAL_TABLET | Freq: Four times a day (QID) | ORAL | Status: DC | PRN

## 2012-02-29 MED ORDER — HYDROCODONE-ACETAMINOPHEN 5-325 MG PO TABS: 1.0000 | ORAL_TABLET | Freq: Four times a day (QID) | ORAL | Status: DC | PRN

## 2012-02-29 MED ORDER — PREDNISONE 20 MG PO TABS: ORAL_TABLET | ORAL | Status: DC

## 2012-02-29 NOTE — Progress Notes (Signed)
   Patient Name: Amber [name] Date of Birth: 1985-03-31 Medical Record Number: [id number] Gender: female  PCP: Hannah Beat, MD  History of Present Illness:  Amber [name] is a 27 y.o. very pleasant female patient who presents with the following: Back Pain  ongoing for approximately: 3 days The patient has had back pain before but not like this The back pain is localized into the lumbar spine area, but she has had some radiculopathy to the R outer thigh R sided, a few days ago, Sunday morning. Crying b/c hurts so much. Was able to work other days this week, [facility], and it made it hurt more.   Tried some ibuprofen, tylenol, heating pad, some flexeril.   No numbness or tingling. No bowel or bladder incontinence. No focal weakness. Physical therapy: No Chiropractic manipulations: No Acupuncture: No Osteopathic manipulation: No Heat or cold: Minimal effect  Past Medical History, Surgical History, Family History, Medications, Allergies have been reviewed and updated if relevant.  Review of Systems  GEN: No fevers, chills. Nontoxic. Primarily MSK c/o today. MSK: Detailed in the HPI GI: tolerating PO intake without difficulty Neuro: As above  Otherwise the pertinent positives of the ROS are noted above.    Physical Exam  Filed Vitals:   02/29/12 1206  BP: 130/84  Pulse: 78  Temp: 97.7 F (36.5 C)  TempSrc: Oral  Height: 5\' 9"  (1.753 m)  Weight: 244 lb (110.678 kg)  SpO2: 99%    Gen: Well-developed,well-nourished,in no acute distress; alert,appropriate and cooperative throughout examination HEENT: Normocephalic and atraumatic without obvious abnormalities.  Ears, externally no deformities Pulm: Breathing comfortably in no respiratory distress Range of motion at  the waist: Flexion, rotation and lateral bending: minimal flexion, tolerates ext, lateral bending normal  No echymosis or edema Rises to examination table with no difficulty Gait: minimally  antalgic  Inspection/Deformity: No abnormality Paraspinus T:  l4-s1  B Ankle Dorsiflexion (L5,4): 5/5 B Great Toe Dorsiflexion (L5,4): 5/5 Heel Walk (L5): WNL Toe Walk (S1): WNL Rise/Squat (L4): WNL, mild pain  SENSORY B Medial Foot (L4): WNL B Dorsum (L5): WNL B Lateral (S1): WNL Light Touch: WNL Pinprick: WNL  REFLEXES Knee (L4): 2+ Ankle (S1): 2+  B SLR, seated: pain B SLR, supine: pain B FABER: neg B Reverse FABER: neg B Greater Troch: NT B Log Roll: neg B Stork: NT B Sciatic Notch: NT   Assessment and Plan:  Back pain  I reviewed with the patient the structures involved and how they related to diagnosis.   Conservative algorithms for acute back pain generally begin with the following: NSAIDS Muscle Relaxants Mild pain medication Most likely disc herniation - also give some steroids  Meds ordered this encounter  Medications  . predniSONE (DELTASONE) 20 MG tablet    Sig: 2 tabs po daily for 5 days, then 1 po daily    Dispense:  15 tablet    Refill:  0  . traMADol (ULTRAM) 50 MG tablet    Sig: Take 1 tablet (50 mg total) by mouth every 6 (six) hours as needed for pain.    Dispense:  50 tablet    Refill:  2  . HYDROcodone-acetaminophen (NORCO/VICODIN) 5-325 MG per tablet    Sig: Take 1 tablet by mouth every 6 (six) hours as needed for pain.    Dispense:  40 tablet    Refill:  0

## 2013-10-01 ENCOUNTER — Observation Stay: Payer: Self-pay | Admitting: Obstetrics and Gynecology

## 2013-10-02 ENCOUNTER — Inpatient Hospital Stay: Payer: Self-pay | Admitting: Obstetrics and Gynecology

## 2013-10-02 LAB — CBC WITH DIFFERENTIAL/PLATELET
Basophil #: 0.1 10*3/uL (ref 0.0–0.1)
Basophil %: 0.3 %
Eosinophil #: 0.1 10*3/uL (ref 0.0–0.7)
Eosinophil %: 0.4 %
HCT: 39.6 % (ref 35.0–47.0)
HGB: 13.1 g/dL (ref 12.0–16.0)
Lymphocyte #: 2 10*3/uL (ref 1.0–3.6)
Lymphocyte %: 11.5 %
MCH: 31.8 pg (ref 26.0–34.0)
MCHC: 33 g/dL (ref 32.0–36.0)
MCV: 96 fL (ref 80–100)
Monocyte #: 0.9 x10 3/mm (ref 0.2–0.9)
Monocyte %: 5.1 %
Neutrophil #: 14.3 10*3/uL — ABNORMAL HIGH (ref 1.4–6.5)
Neutrophil %: 82.7 %
Platelet: 287 10*3/uL (ref 150–440)
RBC: 4.11 10*6/uL (ref 3.80–5.20)
RDW: 13.8 % (ref 11.5–14.5)
WBC: 17.3 10*3/uL — ABNORMAL HIGH (ref 3.6–11.0)

## 2013-10-03 LAB — HEMATOCRIT: HCT: 35.9 % (ref 35.0–47.0)

## 2014-05-27 NOTE — H&P (Signed)
L&D Evaluation:  History:  HPI -CC: q3-6654m UCs -HPI: 29 y/o G1 @ 39/3 ([facility] with above CC. Preg c/b BMI 41. No VB, LOF or decreased FM. Was seen earlier today for regular PNV and was 1/90/-1   Patient's Surgical History lasik   Medications Pre [place]atal Vitamins  protonix   Allergies NKDA   Social History none   Exam:  Vital Signs AF VS normal and stable   General no apparent distress   Mental Status clear   Chest ctab   Heart rrr no mrgs   Abdomen gravid, non-tender   Pelvic 1.5/90/-3 per RN   Mebranes Intact   FHT 140 baseline, +accels, no decels, mod var   Ucx rare UCs   Impression:  Impression negative labor eval   Plan:  Comments d/c to home with precautions. Has PNV already scheduled for one week GBS neg bmi 41: rNST today. follow up nst/afi next week  A pos/RI/VI/rpr neg/hiv neg/hepB neg/tdap utd/pap neg 2013   Follow Up Appointment already scheduled   Electronic Signatures: [place] BingPickens, Amber [name] (MD)  (Signed 15-Sep-15 18:43)  Authored: L&D Evaluation   Last Updated: 15-Sep-15 18:43 by [place] BingPickens, Yesli Vanderhoff (MD)

## 2014-05-27 NOTE — H&P (Signed)
L&D Evaluation:  History:  HPI -CC: worsening contractions -HPI: 29 y/o G1 @ 39/4 [facility] with above CC. Preg c/b BMI 41. No VB, LOF or decreased FM. Was seen earlier today for regular PNV and was 1/90/-1 x 2 and was discharged to home   Patient's Surgical History lasik   Medications Pre Natal Vitamins  protonix   Allergies NKDA   Social History none   Exam:  Vital Signs AF VS normal and stable   General no apparent distress, moderately uncomfortable with UCs   Mental Status clear   Chest no resp distress   Abdomen gravid, non-tender   Pelvic 4cm per RN at 0130   Mebranes Intact   FHT 140 baseline, +accels, no decels, mod var   Ucx difficult to trace but q2-5 by palpation   Impression:  Impression early and likely transitioning into active labor   Plan:  Comments *IUP: category I tracing *term labor: recheck after epidural; given pt size, difficult to trace UCs so may have to place IUPC *GBS neg *elevated BMI: normal 1hr. follow fetal labor curve. [place] approx 3700gm  A pos/RI/VI/rpr neg/hiv neg/hepB neg/tdap utd/pap neg 2013   Electronic Signatures: [place] BingPickens, Reynolds Kittel (MD)  (Signed 16-Sep-15 03:22)  Authored: L&D Evaluation   Last Updated: 16-Sep-15 03:22 by [place] BingPickens, Jobin Montelongo (MD)

## 2014-07-22 ENCOUNTER — Encounter: Payer: Self-pay | Admitting: *Deleted

## 2014-07-22 ENCOUNTER — Encounter: Payer: Self-pay | Admitting: Family Medicine

## 2014-07-22 ENCOUNTER — Ambulatory Visit (INDEPENDENT_AMBULATORY_CARE_PROVIDER_SITE_OTHER): Payer: BLUE CROSS/BLUE SHIELD | Admitting: Family Medicine

## 2014-07-22 ENCOUNTER — Ambulatory Visit (INDEPENDENT_AMBULATORY_CARE_PROVIDER_SITE_OTHER)
Admission: RE | Admit: 2014-07-22 | Discharge: 2014-07-22 | Disposition: A | Discharge status: Home / Self Care | Payer: BLUE CROSS/BLUE SHIELD | Source: Ambulatory Visit | Attending: Family Medicine | Admitting: Family Medicine

## 2014-07-22 VITALS — BP 114/76 | HR 86 | Temp 98.5°F | Ht 69.0 in | Wt 276.8 lb

## 2014-07-22 DIAGNOSIS — R1012 Left upper quadrant pain: Secondary | ICD-10-CM

## 2014-07-22 DIAGNOSIS — M549 Dorsalgia, unspecified: Secondary | ICD-10-CM

## 2014-07-22 DIAGNOSIS — R109 Unspecified abdominal pain: Secondary | ICD-10-CM

## 2014-07-22 LAB — POCT URINALYSIS DIPSTICK
Bilirubin, UA: NEGATIVE
Glucose, UA: NEGATIVE
Ketones, UA: NEGATIVE
Leukocytes, UA: NEGATIVE
Nitrite, UA: NEGATIVE
Spec Grav, UA: >=1.03
UROBILINOGEN UA: 0.2
pH, UA: 6

## 2014-07-22 MED ORDER — TRAMADOL HCL 50 MG PO TABS: 50.0000 mg | ORAL_TABLET | Freq: Three times a day (TID) | ORAL | Status: DC | PRN

## 2014-07-22 MED ORDER — DICLOFENAC SODIUM 75 MG PO TBEC: 75.0000 mg | DELAYED_RELEASE_TABLET | Freq: Two times a day (BID) | ORAL | Status: DC

## 2014-07-22 MED ORDER — CYCLOBENZAPRINE HCL 5 MG PO TABS: 5.0000 mg | ORAL_TABLET | Freq: Every day | ORAL | Status: DC

## 2014-07-22 NOTE — Patient Instructions (Signed)
We will call with X-ray results to rule out kidney stone Start diclofenac 75 mg twice daily for  Inflammation.  Can use muscle relaxant at night for muscle spasm. Use tramadol for breakthrough pain as needed.  Push lots of fluids.  Go to ER for severe pain.

## 2014-07-22 NOTE — Progress Notes (Signed)
Pre visit review using our clinic review tool, if applicable. No additional management support is needed unless otherwise documented below in the visit note. 

## 2014-07-22 NOTE — Assessment & Plan Note (Signed)
MSK strain versus kidney stone.  Unclear if blood seen in urine is from spotting from mirena versus in urine.  Will check KUB to eval for calcium stone.  Push fluids.  Will go ahead and treat for MSK thoracic back pain as cause with NSAIDs, muscle relaxants and tramadol for break through pain.

## 2014-07-22 NOTE — Progress Notes (Signed)
   Subjective:    Patient ID: Amber LeaderNichole M Giuliani, female    DOB: Oct 28, 1985, 29 y.o.   MRN: [id number]  HPI   29 year old female  Pt of Dr. Cyndie Chimeopland's with history of obesity, sleep apnea, anxiety, GERD who presents with new onset pain in middle back on left side. She reports new onset pain 2 days ago, started as ache on left side. Pain worsened, now  on pain scale 9-10/10, currently 6/10.  Pain sharp off and of. Worse with moving and taking a deep breath. No falls or recent injury.  No numbness, no tingling.  No dysuria, she has decreased her UOP in last few days. She recently had mirena puts in.. Having intermittent spotting in last month.  She has tried ibuprofen  600 mg and heating pad. Helped slightly.   She has history of sciatica, no pain now into buttock on lower back. No personal history of kidney stone . No family history of kidney stone.    Review of Systems  Constitutional: Negative for fever and fatigue.  HENT: Negative for ear pain.   Eyes: Negative for pain.  Respiratory: Negative for chest tightness and shortness of breath.   Cardiovascular: Negative for chest pain, palpitations and leg swelling.  Gastrointestinal: Negative for abdominal pain.  Genitourinary: Negative for dysuria.       Objective:   Physical Exam  Constitutional: Vital signs are normal. She appears well-developed and well-nourished. She is cooperative.  Non-toxic appearance. She does not appear ill. No distress.  Unable to get comfortable  HENT:  Head: Normocephalic.  Nose: No mucosal edema or rhinorrhea. Right sinus exhibits no maxillary sinus tenderness and no frontal sinus tenderness. Left sinus exhibits no maxillary sinus tenderness and no frontal sinus tenderness.  Mouth/Throat: Uvula is midline, oropharynx is clear and moist and mucous membranes are normal.  Eyes: Conjunctivae, EOM and lids are normal. Pupils are equal, round, and reactive to light. Lids are everted and swept, no foreign  bodies found.  Neck: Trachea normal and normal range of motion. Neck supple. Carotid bruit is not present. No thyroid mass and no thyromegaly present.  Cardiovascular: Normal rate, regular rhythm, S1 normal, S2 normal, normal heart sounds, intact distal pulses and normal pulses.  Exam reveals no gallop and no friction rub.   No murmur heard. Pulmonary/Chest: Effort normal and breath sounds normal. No tachypnea. No respiratory distress. She has no decreased breath sounds. She has no wheezes. She has no rhonchi. She has no rales.  Abdominal: Soft. Normal appearance and bowel sounds are normal. There is no hepatosplenomegaly. There is no tenderness. There is CVA tenderness.  Left CVA tenderness with radiation of pain to left flank  Neurological: She is alert.  Skin: Skin is warm, dry and intact. No rash noted.  Psychiatric: Her speech is normal and behavior is normal. Judgment and thought content normal. Her mood appears not anxious. Cognition and memory are normal. She does not exhibit a depressed mood.          Assessment & Plan:

## 2014-07-23 ENCOUNTER — Telehealth: Payer: Self-pay | Admitting: Family Medicine

## 2014-07-23 ENCOUNTER — Ambulatory Visit (INDEPENDENT_AMBULATORY_CARE_PROVIDER_SITE_OTHER)
Admission: RE | Admit: 2014-07-23 | Discharge: 2014-07-23 | Disposition: A | Discharge status: Home / Self Care | Payer: BLUE CROSS/BLUE SHIELD | Source: Ambulatory Visit | Attending: Family Medicine | Admitting: Family Medicine

## 2014-07-23 DIAGNOSIS — R109 Unspecified abdominal pain: Secondary | ICD-10-CM

## 2014-07-23 NOTE — Telephone Encounter (Signed)
See Renal CT results.  Any further recommendations?

## 2014-07-23 NOTE — Telephone Encounter (Signed)
Pt is not feeling any better and wants a ct scheduled cb number [phone].

## 2014-07-23 NOTE — Telephone Encounter (Signed)
Patient called back to make sure I got her message.  I advised I did but that Dr. Ermalene SearingBedsole was out of the office today.   Spoke with Dr. Patsy Lageropland who ok'd order for Renal CT.

## 2014-07-24 NOTE — Telephone Encounter (Signed)
Reviewed CT.  No stones. Please get update on how pt feeling, symptoms etc.

## 2014-07-24 NOTE — Telephone Encounter (Signed)
Called Amber [name] advising that CT was reviewed and no stones were found. Patient states that she is feeling a little better and that the pain is still there, but it is not as severe. Patient says she will call back if the pain gets worse.

## 2014-07-25 NOTE — Telephone Encounter (Signed)
Noted  

## 2014-10-20 ENCOUNTER — Encounter: Payer: Self-pay | Admitting: Gastroenterology

## 2014-10-22 ENCOUNTER — Encounter: Payer: Self-pay | Admitting: *Deleted

## 2014-10-22 ENCOUNTER — Emergency Department
Admission: EM | Admit: 2014-10-22 | Discharge: 2014-10-23 | Discharge status: Against Medical Advice | Payer: BLUE CROSS/BLUE SHIELD | Attending: Emergency Medicine | Admitting: Emergency Medicine

## 2014-10-22 DIAGNOSIS — R51 Headache: Secondary | ICD-10-CM | POA: Insufficient documentation

## 2014-10-22 DIAGNOSIS — R110 Nausea: Secondary | ICD-10-CM | POA: Insufficient documentation

## 2014-10-22 NOTE — ED Notes (Signed)
Pt has a headache for 1 day.  Pt took 2 imitrex without relief.  Pt has nausea.

## 2014-10-23 ENCOUNTER — Encounter: Payer: Self-pay | Admitting: Family Medicine

## 2014-10-23 ENCOUNTER — Ambulatory Visit (INDEPENDENT_AMBULATORY_CARE_PROVIDER_SITE_OTHER): Payer: BLUE CROSS/BLUE SHIELD | Admitting: Family Medicine

## 2014-10-23 VITALS — BP 118/80 | HR 62 | Temp 98.0°F | Ht 69.0 in | Wt 266.2 lb

## 2014-10-23 DIAGNOSIS — M5412 Radiculopathy, cervical region: Secondary | ICD-10-CM

## 2014-10-23 MED ORDER — DICLOFENAC SODIUM 75 MG PO TBEC: 75.0000 mg | DELAYED_RELEASE_TABLET | Freq: Two times a day (BID) | ORAL | Status: DC

## 2014-10-23 MED ORDER — CYCLOBENZAPRINE HCL 5 MG PO TABS: 5.0000 mg | ORAL_TABLET | Freq: Every day | ORAL | Status: DC

## 2014-10-23 NOTE — Patient Instructions (Signed)
Heat and massage. Start diclofenac twice daily and flexeril at bedtime. Follow up with Dr. Patsy Lager in 2 weeks if not improving.  Cervical Radiculopathy Cervical radiculopathy happens when a nerve in the neck (cervical nerve) is pinched or bruised. This condition can develop because of an injury or as part of the normal aging process. Pressure on the cervical nerves can cause pain or numbness that runs from the neck all the way down into the arm and fingers. Usually, this condition gets better with rest. Treatment may be needed if the condition does not improve.  CAUSES This condition may be caused by:  Injury.  Slipped (herniated) disk.  Muscle tightness in the neck because of overuse.  Arthritis.  Breakdown or degeneration in the bones and joints of the spine (spondylosis) due to aging.  Bone spurs that may develop near the cervical nerves. SYMPTOMS Symptoms of this condition include:  Pain that runs from the neck to the arm and hand. The pain can be severe or irritating. It may be worse when the neck is moved.  Numbness or weakness in the affected arm and hand. DIAGNOSIS This condition may be diagnosed based on symptoms, medical history, and a physical exam. You may also have tests, including:  X-rays.  CT scan.  MRI.  Electromyogram (EMG).  Nerve conduction tests. TREATMENT In many cases, treatment is not needed for this condition. With rest, the condition usually gets better over time. If treatment is needed, options may include:  Wearing a soft neck collar for short periods of time.  Physical therapy to strengthen your neck muscles.  Medicines, such as NSAIDs, oral corticosteroids, or spinal injections.  Surgery. This may be needed if other treatments do not help. Various types of surgery may be done depending on the cause of your problems. HOME CARE INSTRUCTIONS Managing Pain  Take over-the-counter and prescription medicines only as told by your health care  provider.  If directed, apply ice to the affected area.  Put ice in a plastic bag.  Place a towel between your skin and the bag.  Leave the ice on for 20 minutes, 2-3 times per day.  If ice does not help, you can try using heat. Take a warm shower or warm bath, or use a heat pack as told by your health care provider.  Try a gentle neck and shoulder massage to help relieve symptoms. Activity  Rest as needed. Follow instructions from your health care provider about any restrictions on activities.  Do stretching and strengthening exercises as told by your health care provider or physical therapist. General Instructions  If you were given a soft collar, wear it as told by your health care provider.  Look into pillow for neck support or flat pillow.  Keep all follow-up visits as told by your health care provider. This is important. SEEK MEDICAL CARE IF:  Your condition does not improve with treatment. SEEK IMMEDIATE MEDICAL CARE IF:  Your pain gets much worse and cannot be controlled with medicines.  You have weakness or numbness in your hand, arm, face, or leg.  You have a high fever.  You have a stiff, rigid neck.  You lose control of your bowels or your bladder (have incontinence).  You have trouble with walking, balance, or speaking.   This information is not intended to replace advice given to you by your health care provider. Make sure you discuss any questions you have with your health care provider.   Document Released: 09/28/2000 Document Revised:  09/24/2014 Document Reviewed: 02/27/2014 Elsevier Interactive Patient Education [employer]

## 2014-10-23 NOTE — Progress Notes (Signed)
Subjective:    Patient ID: Amber [name], female    DOB: April 24, 1985, 29 y.o.   MRN: [id number]  Headache  Pertinent negatives include no abdominal pain, ear pain, eye pain or fever.    29 year old female with history of migraine presents with acute onset pain in neck  X 4-5 weeks.  She reports right sided neck pain, headache in occiput.  Radiates to right shoulder and right arm and numbness and tingling in right hand. Dull aching to sharp pain. Occ weak grip off and on.  Started when she woke up 4-5 weeks back.  Pain increases with moving neck/head.  No known injury, no fell, no MVA, no change inactivity.   Seen at ER last night .Marland Kitchen Never saw anybody given wait. Left.  Imitrex helped with migraine last night.  Using massage, heat, OTC patches. Used some leftover flexeril, tylenol .Marland Kitchen Mild relief.   Review of Systems  Constitutional: Negative for fever and fatigue.  HENT: Negative for ear pain.   Eyes: Negative for pain.  Respiratory: Negative for chest tightness and shortness of breath.   Cardiovascular: Negative for chest pain, palpitations and leg swelling.  Gastrointestinal: Negative for abdominal pain.  Genitourinary: Negative for dysuria.      Social History /Family History/Past Medical History reviewed and updated if needed. No past neck issues other than MSK strain. No past neck surgeries.  Objective:   Physical Exam  Constitutional: Vital signs are normal. She appears well-developed and well-nourished. She is cooperative.  Non-toxic appearance. She does not appear ill. No distress.  HENT:  Head: Normocephalic.  Right Ear: Hearing, tympanic membrane, external ear and ear canal normal. Tympanic membrane is not erythematous, not retracted and not bulging.  Left Ear: Hearing, tympanic membrane, external ear and ear canal normal. Tympanic membrane is not erythematous, not retracted and not bulging.  Nose: No mucosal edema or rhinorrhea. Right sinus exhibits no  maxillary sinus tenderness and no frontal sinus tenderness. Left sinus exhibits no maxillary sinus tenderness and no frontal sinus tenderness.  Mouth/Throat: Uvula is midline, oropharynx is clear and moist and mucous membranes are normal.  Eyes: Conjunctivae, EOM and lids are normal. Pupils are equal, round, and reactive to light. Lids are everted and swept, no foreign bodies found.  Neck: Trachea normal and normal range of motion. Neck supple. Carotid bruit is not present. No thyroid mass and no thyromegaly present.  Cardiovascular: Normal rate, regular rhythm, S1 normal, S2 normal, normal heart sounds, intact distal pulses and normal pulses.  Exam reveals no gallop and no friction rub.   No murmur heard. Pulmonary/Chest: Effort normal and breath sounds normal. No tachypnea. No respiratory distress. She has no decreased breath sounds. She has no wheezes. She has no rhonchi. She has no rales.  Abdominal: Soft. Normal appearance and bowel sounds are normal. There is no tenderness.  Musculoskeletal:       Right shoulder: She exhibits tenderness. She exhibits normal range of motion, no bony tenderness and no swelling.       Cervical back: She exhibits decreased range of motion and tenderness. She exhibits no bony tenderness.  Slightly positive spurling's on right  neg empty can test, no focal shoulder pain  ttp over trapezius on right Neg tinel and phalen  slightly positive ulnar nerve compression test.  Neurological: She is alert.  Skin: Skin is warm, dry and intact. No rash noted.  Psychiatric: Her speech is normal and behavior is normal. Judgment and thought content normal.  Her mood appears not anxious. Cognition and memory are normal. She does not exhibit a depressed mood.            Assessment & Plan:

## 2014-10-23 NOTE — Assessment & Plan Note (Signed)
No bony tenderness and no injury  Suggesting need for X-ray. NSAIDs, muscle relaxant, ice or heat, massage, limit head movement ( pt refuse cervical collar). Info given. If not improving in 2 weeks follow up.

## 2014-10-23 NOTE — Progress Notes (Signed)
Pre visit review using our clinic review tool, if applicable. No additional management support is needed unless otherwise documented below in the visit note. 

## 2014-10-24 ENCOUNTER — Telehealth: Payer: Self-pay | Admitting: Family Medicine

## 2014-10-24 NOTE — Telephone Encounter (Signed)
Spoke with Hanna and advised that she should be able to get a neck brace without a prescription at any pharmacy.

## 2014-10-24 NOTE — Telephone Encounter (Signed)
Pt called and would like to get the neck brace to wear around the house. Please advise  Call back # [phone]

## 2014-12-22 ENCOUNTER — Telehealth: Payer: Self-pay | Admitting: *Deleted

## 2014-12-22 NOTE — Telephone Encounter (Signed)
Left message for Vibha to call our office to schedule flu vaccine or let us know if she has had once done somewhere else.

## 2014-12-25 NOTE — Telephone Encounter (Signed)
Entered in Immunization record.

## 2014-12-25 NOTE — Telephone Encounter (Signed)
Patient returned Donna's call.  She had her flu shot the end of September [facility].

## 2015-01-07 ENCOUNTER — Emergency Department
Admission: EM | Admit: 2015-01-07 | Discharge: 2015-01-07 | Disposition: A | Discharge status: Home / Self Care | Payer: BLUE CROSS/BLUE SHIELD | Attending: Emergency Medicine | Admitting: Emergency Medicine

## 2015-01-07 ENCOUNTER — Encounter: Payer: Self-pay | Admitting: *Deleted

## 2015-01-07 ENCOUNTER — Emergency Department: Payer: BLUE CROSS/BLUE SHIELD

## 2015-01-07 DIAGNOSIS — S39012A Strain of muscle, fascia and tendon of lower back, initial encounter: Secondary | ICD-10-CM | POA: Diagnosis not present

## 2015-01-07 DIAGNOSIS — S3992XA Unspecified injury of lower back, initial encounter: Secondary | ICD-10-CM | POA: Diagnosis present

## 2015-01-07 DIAGNOSIS — X58XXXA Exposure to other specified factors, initial encounter: Secondary | ICD-10-CM | POA: Insufficient documentation

## 2015-01-07 DIAGNOSIS — Y9389 Activity, other specified: Secondary | ICD-10-CM | POA: Insufficient documentation

## 2015-01-07 DIAGNOSIS — Y998 Other external cause status: Secondary | ICD-10-CM | POA: Diagnosis not present

## 2015-01-07 DIAGNOSIS — Z3202 Encounter for pregnancy test, result negative: Secondary | ICD-10-CM | POA: Diagnosis not present

## 2015-01-07 DIAGNOSIS — Y9289 Other specified places as the place of occurrence of the external cause: Secondary | ICD-10-CM | POA: Diagnosis not present

## 2015-01-07 LAB — COMPREHENSIVE METABOLIC PANEL
ALK PHOS: 55 U/L (ref 38–126)
ALT: 18 U/L (ref 14–54)
ANION GAP: 6 (ref 5–15)
AST: 17 U/L (ref 15–41)
Albumin: 4.2 g/dL (ref 3.5–5.0)
BILIRUBIN TOTAL: 0.8 mg/dL (ref 0.3–1.2)
BUN: 19 mg/dL (ref 6–20)
CALCIUM: 9.4 mg/dL (ref 8.9–10.3)
CO2: 29 mmol/L (ref 22–32)
Chloride: 104 mmol/L (ref 101–111)
Creatinine, Ser: 0.68 mg/dL (ref 0.44–1.00)
Glucose, Bld: 100 mg/dL — ABNORMAL HIGH (ref 65–99)
Potassium: 4.1 mmol/L (ref 3.5–5.1)
Sodium: 139 mmol/L (ref 135–145)
Total Protein: 7.4 g/dL (ref 6.5–8.1)

## 2015-01-07 LAB — URINALYSIS COMPLETE WITH MICROSCOPIC (ARMC ONLY)
Bilirubin Urine: NEGATIVE
GLUCOSE, UA: NEGATIVE mg/dL
Ketones, ur: NEGATIVE mg/dL
Leukocytes, UA: NEGATIVE
NITRITE: NEGATIVE
Protein, ur: NEGATIVE mg/dL
SPECIFIC GRAVITY, URINE: 1.024 (ref 1.005–1.030)
pH: 6 (ref 5.0–8.0)

## 2015-01-07 LAB — CBC WITH DIFFERENTIAL/PLATELET
Basophils Absolute: 0 10*3/uL (ref 0–0.1)
Basophils Relative: 0 %
Eosinophils Absolute: 0.6 10*3/uL (ref 0–0.7)
Eosinophils Relative: 8 %
HEMATOCRIT: 39.2 % (ref 35.0–47.0)
HEMOGLOBIN: 12.7 g/dL (ref 12.0–16.0)
LYMPHS ABS: 1.6 10*3/uL (ref 1.0–3.6)
Lymphocytes Relative: 22 %
MCH: 28.9 pg (ref 26.0–34.0)
MCHC: 32.4 g/dL (ref 32.0–36.0)
MCV: 89.3 fL (ref 80.0–100.0)
MONOS PCT: 6 %
Monocytes Absolute: 0.5 10*3/uL (ref 0.2–0.9)
NEUTROS PCT: 64 %
Neutro Abs: 4.8 10*3/uL (ref 1.4–6.5)
Platelets: 309 10*3/uL (ref 150–440)
RBC: 4.39 MIL/uL (ref 3.80–5.20)
RDW: 13.3 % (ref 11.5–14.5)
WBC: 7.5 10*3/uL (ref 3.6–11.0)

## 2015-01-07 LAB — LIPASE, BLOOD: Lipase: 23 U/L (ref 11–51)

## 2015-01-07 LAB — PREGNANCY, URINE: Preg Test, Ur: NEGATIVE

## 2015-01-07 MED ORDER — MORPHINE SULFATE (PF) 4 MG/ML IV SOLN
4.0000 mg | Freq: Once | INTRAVENOUS | Status: AC
  Administered 2015-01-07: 4 mg via INTRAVENOUS
  Filled 2015-01-07: qty 1

## 2015-01-07 MED ORDER — DIAZEPAM 5 MG PO TABS: 5.0000 mg | ORAL_TABLET | Freq: Three times a day (TID) | ORAL | Status: DC | PRN

## 2015-01-07 MED ORDER — KETOROLAC TROMETHAMINE 10 MG PO TABS: 10.0000 mg | ORAL_TABLET | Freq: Four times a day (QID) | ORAL | Status: DC | PRN

## 2015-01-07 MED ORDER — DIAZEPAM 5 MG/ML IJ SOLN
5.0000 mg | Freq: Once | INTRAMUSCULAR | Status: AC
  Administered 2015-01-07: 5 mg via INTRAVENOUS
  Filled 2015-01-07: qty 2

## 2015-01-07 MED ORDER — DEXAMETHASONE SODIUM PHOSPHATE 10 MG/ML IJ SOLN
10.0000 mg | Freq: Once | INTRAMUSCULAR | Status: AC
Start: 2015-01-07 — End: 2015-01-07
  Administered 2015-01-07: 10 mg via INTRAVENOUS
  Filled 2015-01-07: qty 1

## 2015-01-07 NOTE — Discharge Instructions (Signed)

## 2015-01-07 NOTE — ED Provider Notes (Signed)
[facility] Emergency Department Provider Note     Time seen: ----------------------------------------- 9:01 AM on 01/07/2015 -----------------------------------------    I have reviewed the triage vital signs and the nursing notes.   HISTORY  Chief Complaint Back Pain    HPI Amber [name] is a 29 y.o. female who presents ER for severe right-sided low back pain started yesterday. Patient denies any radicular pain down the legs, denies any numbness or trouble with bowel or bladder function. Patient has had a history of kidney stones in the past, denies any dysuria. She denies any recent illness or fevers, states she received Toradol yesterday without improvement.   Past Medical History  Diagnosis Date  . Headache(784.0)   . Morbid obesity (HCC)   . Palpitations   . Anemia     iron deficiency  . GERD (gastroesophageal reflux disease)   . Stress fracture     Medial tibial plateau; mild medial retinacular sprain and PF lig sprain     Patient Active Problem List   Diagnosis Date Noted  . Acute cervical radiculopathy 10/23/2014  . Acute left flank pain 07/22/2014  . RUQ PAIN 01/06/2010  . HEARTBURN 08/14/2009  . ANXIETY STATE, UNSPECIFIED 03/27/2009  . PAROXYSMAL TACHYCARDIA 03/27/2009  . FRACTURE, TIBIAL PLATEAU 09/21/2008  . CERVICALGIA 06/03/2008  . ANEMIA-IRON DEFICIENCY 04/17/2008  . GERD 04/17/2008  . OBESITY 12/04/2006  . Obstructive sleep apnea (adult) (pediatric) 12/04/2006  . TEMPOROMANDIBULAR JOINT PAIN 12/04/2006  . MIGRAINE HEADACHE 10/05/2006    Past Surgical History  Procedure Laterality Date  . Tubes in ears      multiple     Allergies Dilaudid and Latex  Social History Social History  Substance Use Topics  . Smoking status: Never Smoker   . Smokeless tobacco: Never Used  . Alcohol Use: No    Review of Systems Constitutional: Negative for fever. Eyes: Negative for visual changes. ENT: Negative for sore  throat. Cardiovascular: Negative for chest pain. Respiratory: Negative for shortness of breath. Gastrointestinal: Negative for abdominal pain, vomiting and diarrhea. Genitourinary: Negative for dysuria. Musculoskeletal: Positive for back pain Skin: Negative for rash. Neurological: Negative for headaches, focal weakness or numbness.  10-point ROS otherwise negative.  ____________________________________________   PHYSICAL EXAM:  VITAL SIGNS: ED Triage Vitals  Enc Vitals Group     BP 01/07/15 0851 121/84 mmHg     Pulse Rate 01/07/15 0851 83     Resp 01/07/15 0851 18     Temp 01/07/15 0851 97.4 F (36.3 C)     Temp Source 01/07/15 0851 Oral     SpO2 01/07/15 0851 100 %     Weight 01/07/15 0851 260 lb (117.935 kg)     Height 01/07/15 0851  (1.753 m)     Head Cir --      Peak Flow --      Pain Score 01/07/15 0847 8     Pain Loc --      Pain Edu? --      Excl. in GC? --     Constitutional: Alert and oriented. Well appearing in mild distress Eyes: Conjunctivae are normal. PERRL. Normal extraocular movements. ENT   Head: Normocephalic and atraumatic.   Nose: No congestion/rhinnorhea.   Mouth/Throat: Mucous membranes are moist.   Neck: No stridor. Cardiovascular: Normal rate, regular rhythm. Normal and symmetric distal pulses are present in all extremities. No murmurs, rubs, or gallops. Respiratory: Normal respiratory effort without tachypnea nor retractions. Breath sounds are clear and equal bilaterally.  No wheezes/rales/rhonchi. Gastrointestinal: Soft and nontender. No distention. No abdominal bruits.  Musculoskeletal: Range of motion of the leg seems to elicit pain in the right lower back. She has a negative cross is straight leg raise exam for radicular pain, all he has pain in the right lower back when she moves her legs. Neurologic:  Normal speech and language. No gross focal neurologic deficits are appreciated. Speech is normal. No gait  instability. Skin:  Skin is warm, dry and intact. No rash noted. Psychiatric: Mood and affect are normal. Speech and behavior are normal. Patient exhibits appropriate insight and judgment. ____________________________________________  ED COURSE:  Pertinent labs & imaging results that were available during my care of the patient were reviewed by me and considered in my medical decision making (see chart for details). She is in no acute distress, will check basic labs and give IV morphine for pain. ____________________________________________    LABS (pertinent positives/negatives)  Labs Reviewed  COMPREHENSIVE METABOLIC PANEL - Abnormal; Notable for the following:    Glucose, Bld 100 (*)    All other components within normal limits  URINALYSIS COMPLETEWITH MICROSCOPIC ([facility] ONLY) - Abnormal; Notable for the following:    Color, Urine YELLOW (*)    APPearance HAZY (*)    Hgb urine dipstick 1+ (*)    Bacteria, UA RARE (*)    Squamous Epithelial / LPF 6-30 (*)    All other components within normal limits  CBC WITH DIFFERENTIAL/PLATELET  LIPASE, BLOOD  PREGNANCY, URINE    RADIOLOGY Images were viewed by me  LS-spine films are unremarkable  ____________________________________________  FINAL ASSESSMENT AND PLAN  Acute low back pain  Plan: Patient with labs and imaging as dictated above. Pain is likely muscle spasms in the low back. She was given Valium and morphine IV here. She'll be discharged with Toradol, Valium and close follow-up with her doctor. I do not think this is kidney stone related.   Emily FilbertWilliams, Jonathan E, MD   Emily FilbertJonathan E Williams, MD 01/07/15 [phone]

## 2015-01-07 NOTE — ED Notes (Signed)
Pt complains of right sided back pain started yesterday, pt denies any other symptoms, pt has a history of 1 kidney stone in the past

## 2015-01-08 ENCOUNTER — Encounter: Payer: Self-pay | Admitting: *Deleted

## 2015-01-08 ENCOUNTER — Encounter: Payer: Self-pay | Admitting: Family Medicine

## 2015-01-08 ENCOUNTER — Ambulatory Visit (INDEPENDENT_AMBULATORY_CARE_PROVIDER_SITE_OTHER): Payer: BLUE CROSS/BLUE SHIELD | Admitting: Family Medicine

## 2015-01-08 VITALS — BP 144/98 | HR 72 | Temp 97.6°F | Wt 280.0 lb

## 2015-01-08 DIAGNOSIS — M545 Low back pain, unspecified: Secondary | ICD-10-CM

## 2015-01-08 MED ORDER — DEXAMETHASONE SODIUM PHOSPHATE 10 MG/ML IJ SOLN
10.0000 mg | Freq: Once | INTRAMUSCULAR | Status: AC
  Administered 2015-01-08: 10 mg via INTRAMUSCULAR

## 2015-01-08 MED ORDER — PREDNISONE 20 MG PO TABS: ORAL_TABLET | ORAL | Status: DC

## 2015-01-08 NOTE — Progress Notes (Signed)
Pre visit review using our clinic review tool, if applicable. No additional management support is needed unless otherwise documented below in the visit note. 

## 2015-01-08 NOTE — Progress Notes (Signed)
Subjective:   Patient ID: Amber [name], female    DOB: April 16, 1985, 29 y.o.   MRN: [id number]  Amber [name] is a pleasant 29 y.o. year old female pt of Dr. Patsy Lageropland, new to me, who presents to clinic today with Hospitalization Follow-up  on 01/08/2015  HPI:  ER follow up- [facility], 01/07/15 for severe acute onset of right back pain.  Notes and studies reviewed.  She denies any radiculopathy or urinary symptoms.  Lumbar xray unremarkable.   UA- 1+ blood, otherwise unremarkable. CBC, CMET, lipase all unremarkable as well.  Pain has worsened since she was in the ER.  Now pain is constant, 10/10 and is starting to radiate down her right leg.  No LE weakness.  No urinary symptoms.   Dg Lumbar Spine 2-3 Views  01/07/2015  CLINICAL DATA:  Lumbago for 1 day.  No known injury EXAM: LUMBAR SPINE - 2-3 VIEW COMPARISON:  None. FINDINGS: Frontal, lateral, and spot lumbosacral lateral images were obtained. There are 5 non-rib-bearing lumbar type vertebral bodies. There is no fracture or spondylolisthesis. Disc spaces appear normal. There are small anterior osteophytes at L3 and L4. No erosive change. There is an intrauterine device in mid pelvis. IMPRESSION: No appreciable disc space narrowing. No fracture or spondylolisthesis. Intrauterine device in mid pelvis. Electronically Signed   By: Bretta BangWilliam  Woodruff III M.D.   On: 01/07/2015 09:40   ER physician suspected muscle spasms and gave her IV morphine and valium in ER and discharged her home with Toradol and Valium.   Current Outpatient Prescriptions on File Prior to Visit  Medication Sig Dispense Refill  . cyclobenzaprine (FLEXERIL) 5 MG tablet Take 1-2 tablets (5-10 mg total) by mouth at bedtime. 30 tablet 0  . diclofenac (VOLTAREN) 75 MG EC tablet Take 1 tablet (75 mg total) by mouth 2 (two) times daily. 30 tablet 0  . ketorolac (TORADOL) 10 MG tablet Take 1 tablet (10 mg total) by mouth every 6 (six) hours as needed. 20  tablet 0  . levonorgestrel (MIRENA) 20 MCG/24HR IUD 1 each by Intrauterine route once.    . SUMAtriptan (IMITREX) 50 MG tablet Take 50 mg by mouth every 2 (two) hours as needed for migraine. May repeat in 2 hours if headache persists or recurs.    . traMADol (ULTRAM) 50 MG tablet Take 1 tablet (50 mg total) by mouth every 8 (eight) hours as needed for moderate pain or severe pain. 30 tablet 0   No current facility-administered medications on file prior to visit.    Allergies  Allergen Reactions  . Dilaudid [Hydromorphone Hcl] Other (See Comments)    Hallucinations  . Latex Rash    Past Medical History  Diagnosis Date  . Headache(784.0)   . Morbid obesity (HCC)   . Palpitations   . Anemia     iron deficiency  . GERD (gastroesophageal reflux disease)   . Stress fracture     Medial tibial plateau; mild medial retinacular sprain and PF lig sprain     Past Surgical History  Procedure Laterality Date  . Tubes in ears      multiple     Family History  Problem Relation Age of Onset  . Hypertension      family hx     Social History   Social History  . Marital Status: Married    Spouse Name: N/A  . Number of Children: N/A  . Years of Education: N/A   Occupational History  .  Not on file.   Social History Main Topics  . Smoking status: Never Smoker   . Smokeless tobacco: Never Used  . Alcohol Use: No  . Drug Use: No  . Sexual Activity: Not on file   Other Topics Concern  . Not on file   Social History Narrative   Married; works as a Pharmacologist at [employer], [employer].    The PMH, PSH, Social History, Family History, Medications, and allergies have been reviewed in [facility], and have been updated if relevant.   Review of Systems  Constitutional: Negative.   Genitourinary: Negative.   Musculoskeletal: Positive for back pain.  Skin: Negative.   Neurological: Negative for tremors, syncope, facial asymmetry and weakness.  Psychiatric/Behavioral: Negative.   All  other systems reviewed and are negative.      Objective:    BP 144/98 mmHg  Pulse 72  Temp(Src) 97.6 F (36.4 C) (Oral)  Wt 280 lb (127.007 kg)  SpO2 99%  LMP 01/05/2015   Physical Exam  Constitutional: She is oriented to person, place, and time. She appears well-developed and well-nourished.  Appear uncomfortable in a seated position  HENT:  Head: Normocephalic.  Eyes: Conjunctivae are normal.  Cardiovascular: Normal rate.   Pulmonary/Chest: Effort normal.  Musculoskeletal: Normal range of motion.  SLR pos right Neg fabers Normal reflexes  Neurological: She is alert and oriented to person, place, and time. No cranial nerve deficit.  Skin: Skin is warm. She is not diaphoretic.  Psychiatric: She has a normal mood and affect. Her behavior is normal. Judgment and thought content normal.  Nursing note and vitals reviewed.         Assessment & Plan:   Right-sided low back pain without sciatica No Follow-up on file.

## 2015-01-08 NOTE — Assessment & Plan Note (Signed)
Does not seem consistent with kidney stones. Likely lumbar strain with sciatica. IM decadron given in office, can start oral prednisone if symptoms persist in 48 hours. Follow up with Dr. Patsy Lageropland next week. MRI may be necessary in future. The patient indicates understanding of these issues and agrees with the plan. e

## 2015-01-08 NOTE — Patient Instructions (Signed)
Good to see you. Please start taking prednisone if symptoms persist in 48- 72 hours. Follow up with Dr. Patsy Lageropland next week.

## 2015-01-09 ENCOUNTER — Telehealth: Payer: Self-pay | Admitting: Family Medicine

## 2015-01-09 ENCOUNTER — Emergency Department
Admission: EM | Admit: 2015-01-09 | Discharge: 2015-01-09 | Disposition: A | Discharge status: Home / Self Care | Payer: BLUE CROSS/BLUE SHIELD | Attending: Emergency Medicine | Admitting: Emergency Medicine

## 2015-01-09 ENCOUNTER — Encounter: Payer: Self-pay | Admitting: Emergency Medicine

## 2015-01-09 DIAGNOSIS — IMO0001 Reserved for inherently not codable concepts without codable children: Secondary | ICD-10-CM

## 2015-01-09 DIAGNOSIS — Z79899 Other long term (current) drug therapy: Secondary | ICD-10-CM | POA: Diagnosis not present

## 2015-01-09 DIAGNOSIS — M5416 Radiculopathy, lumbar region: Secondary | ICD-10-CM | POA: Diagnosis not present

## 2015-01-09 DIAGNOSIS — Z7952 Long term (current) use of systemic steroids: Secondary | ICD-10-CM | POA: Diagnosis not present

## 2015-01-09 DIAGNOSIS — Z791 Long term (current) use of non-steroidal anti-inflammatories (NSAID): Secondary | ICD-10-CM | POA: Insufficient documentation

## 2015-01-09 DIAGNOSIS — Z91040 Latex allergy status: Secondary | ICD-10-CM | POA: Diagnosis not present

## 2015-01-09 DIAGNOSIS — M545 Low back pain: Secondary | ICD-10-CM | POA: Diagnosis present

## 2015-01-09 DIAGNOSIS — F419 Anxiety disorder, unspecified: Secondary | ICD-10-CM | POA: Insufficient documentation

## 2015-01-09 MED ORDER — OXYCODONE-ACETAMINOPHEN 5-325 MG PO TABS
2.0000 | ORAL_TABLET | Freq: Once | ORAL | Status: AC
  Administered 2015-01-09: 2 via ORAL
  Filled 2015-01-09: qty 2

## 2015-01-09 MED ORDER — OXYCODONE-ACETAMINOPHEN 7.5-325 MG PO TABS: 1.0000 | ORAL_TABLET | Freq: Four times a day (QID) | ORAL | Status: DC | PRN

## 2015-01-09 MED ORDER — TRAMADOL HCL 50 MG PO TABS: 50.0000 mg | ORAL_TABLET | Freq: Four times a day (QID) | ORAL | Status: DC | PRN

## 2015-01-09 NOTE — ED Notes (Addendum)
Pt c/o back pain since Tuesday.  Got steroid injection yesterday (decadron) but pain has not improved. Pain worse when moving. Pain down right leg. Reports no bowel movement 4 days. Has went without bowel movement this long before and pain is not same.

## 2015-01-09 NOTE — Telephone Encounter (Signed)
Yes she can start prednisone taper. Follow up next week if not improving.

## 2015-01-09 NOTE — Telephone Encounter (Signed)
Patient's PCP is Dr.Copland.  Patient saw Dr.Aron yesterday for back pain and was given a steroid injection and was told to start Prednisone in 3 days.  Patient felt no relief after the shot.  Patient's taking Toradol and Valium for pain and it hasn't helped.  Can patient start Prednisone now?

## 2015-01-09 NOTE — Telephone Encounter (Signed)
Patient notified

## 2015-01-09 NOTE — Discharge Instructions (Signed)
Advised follow-up family doctor for consult for MRI.

## 2015-01-09 NOTE — ED Provider Notes (Signed)
[facility] Emergency Department Provider Note  ____________________________________________  Time seen: Approximately 4:42 PM  I have reviewed the triage vital signs and the nursing notes.   HISTORY  Chief Complaint Back Pain    HPI AVREE SZCZYGIEL is a 29 y.o. female patient here today for continual complaining of back pain. Patient was seen 2 days ago for nontraumatic back pain. Patient had x-ray on the visit was grossly unremarkable. He was given a prescription for Valium and Toradol. Patient is medicine did not help and she follow-up with family doctor yesterday. Family doctor gave her a steroidal injection of Decadron follow-up with the Medrol Dosepak. Patient state this was started after complaining of radicular component to her pain to the right lower extremity. Patient state her radicular component has worsens. Patient denies any urinary dysfunction. She is rating her pain as a 10 over 10.   Past Medical History  Diagnosis Date  . Headache(784.0)   . Morbid obesity (HCC)   . Palpitations   . Anemia     iron deficiency  . GERD (gastroesophageal reflux disease)   . Stress fracture     Medial tibial plateau; mild medial retinacular sprain and PF lig sprain     Patient Active Problem List   Diagnosis Date Noted  . Right low back pain 01/08/2015  . Acute cervical radiculopathy 10/23/2014  . Acute left flank pain 07/22/2014  . RUQ PAIN 01/06/2010  . HEARTBURN 08/14/2009  . ANXIETY STATE, UNSPECIFIED 03/27/2009  . PAROXYSMAL TACHYCARDIA 03/27/2009  . FRACTURE, TIBIAL PLATEAU 09/21/2008  . CERVICALGIA 06/03/2008  . ANEMIA-IRON DEFICIENCY 04/17/2008  . GERD 04/17/2008  . OBESITY 12/04/2006  . Obstructive sleep apnea (adult) (pediatric) 12/04/2006  . TEMPOROMANDIBULAR JOINT PAIN 12/04/2006  . MIGRAINE HEADACHE 10/05/2006    Past Surgical History  Procedure Laterality Date  . Tubes in ears      multiple     Current Outpatient Rx  Name   Route  Sig  Dispense  Refill  . cyclobenzaprine (FLEXERIL) 5 MG tablet   Oral   Take 1-2 tablets (5-10 mg total) by mouth at bedtime.   30 tablet   0   . diazepam (VALIUM) 5 MG tablet   Oral   Take 5 mg by mouth every 6 (six) hours as needed for anxiety.         . diclofenac (VOLTAREN) 75 MG EC tablet   Oral   Take 1 tablet (75 mg total) by mouth 2 (two) times daily.   30 tablet   0   . ketorolac (TORADOL) 10 MG tablet   Oral   Take 1 tablet (10 mg total) by mouth every 6 (six) hours as needed.   20 tablet   0   . levonorgestrel (MIRENA) 20 MCG/24HR IUD   Intrauterine   1 each by Intrauterine route once.         Marland Kitchen oxyCODONE-acetaminophen (PERCOCET) 7.5-325 MG tablet   Oral   Take 1 tablet by mouth every 6 (six) hours as needed for moderate pain or severe pain.   12 tablet   0   . predniSONE (DELTASONE) 20 MG tablet      2 tabs by mouth daily x 5 days   10 tablet   0   . SUMAtriptan (IMITREX) 50 MG tablet   Oral   Take 50 mg by mouth every 2 (two) hours as needed for migraine. May repeat in 2 hours if headache persists or recurs.         Marland Kitchen  traMADol (ULTRAM) 50 MG tablet   Oral   Take 1 tablet (50 mg total) by mouth every 8 (eight) hours as needed for moderate pain or severe pain.   30 tablet   0     Allergies Dilaudid and Latex  Family History  Problem Relation Age of Onset  . Hypertension      family hx     Social History Social History  Substance Use Topics  . Smoking status: Never Smoker   . Smokeless tobacco: Never Used  . Alcohol Use: No    Review of Systems Constitutional: No fever/chills. No overt obesity Eyes: No visual changes. ENT: No sore throat. Cardiovascular: Denies chest pain. Respiratory: Denies shortness of breath. Gastrointestinal: No abdominal pain.  No nausea, no vomiting.  No diarrhea.  No constipation. Genitourinary: Negative for dysuria. Musculoskeletal: Positive for back pain. Skin: Negative for  rash. Neurological: Negative for headaches, focal weakness or numbness. Psychiatric:Anxiety Endocrine: Hematological/Lymphatic: Allergic/ImmunilogicalDilaudid 10-point ROS otherwise negative.  ____________________________________________   PHYSICAL EXAM:  VITAL SIGNS: ED Triage Vitals  Enc Vitals Group     BP 01/09/15 1602 125/79 mmHg     Pulse Rate 01/09/15 1602 85     Resp 01/09/15 1602 20     Temp 01/09/15 1602 97.5 F (36.4 C)     Temp Source 01/09/15 1602 Oral     SpO2 01/09/15 1602 99 %     Weight 01/09/15 1602 260 lb (117.935 kg)     Height 01/09/15 1602 5\' 9"  (1.753 m)     Head Cir --      Peak Flow --      Pain Score 01/09/15 1613 10     Pain Loc --      Pain Edu? --      Excl. in GC? --     Constitutional: Alert and oriented. Well appearing and in no acute distress. Eyes: Conjunctivae are normal. PERRL. EOMI. Head: Atraumatic. Nose: No congestion/rhinnorhea. Mouth/Throat: Mucous membranes are moist.  Oropharynx non-erythematous. Neck: No stridor. No cervical spine tenderness to palpation. Hematological/Lymphatic/Immunilogical: No cervical lymphadenopathy. Cardiovascular: Normal rate, regular rhythm. Grossly normal heart sounds.  Good peripheral circulation. Respiratory: Normal respiratory effort.  No retractions. Lungs CTAB. Gastrointestinal: Soft and nontender. No distention. No abdominal bruits. No CVA tenderness. Musculoskeletal: No lower extremity tenderness nor edema.  No joint effusions. Neurologic:  Normal speech and language. No gross focal neurologic deficits are appreciated. No gait instability. Skin:  Skin is warm, dry and intact. No rash noted. Psychiatric: Mood and affect are normal. Speech and behavior are normal.  ____________________________________________   LABS (all labs ordered are listed, but only abnormal results are displayed)  Labs Reviewed - No data to  display ____________________________________________  EKG   ____________________________________________  RADIOLOGY   ____________________________________________   PROCEDURES  Procedure(s) performed: None  Critical Care performed: No  ____________________________________________   INITIAL IMPRESSION / ASSESSMENT AND PLAN / ED COURSE  Pertinent labs & imaging results that were available during my care of the patient were reviewed by me and considered in my medical decision making (see chart for details).  Radicular low back pain. Advised patient continue previous medications. Aspiration follow-up family doctor consider consult for an MRI. ____________________________________________   FINAL CLINICAL IMPRESSION(S) / ED DIAGNOSES  Final diagnoses:  Radicular pain of right lower back      Joni ReiningRonald K Smith, PA-C 01/09/15 1650  Joni Reiningonald K Smith, PA-C 01/09/15 1653  Minna AntisKevin Paduchowski, MD 01/09/15 1945

## 2015-01-14 ENCOUNTER — Ambulatory Visit (INDEPENDENT_AMBULATORY_CARE_PROVIDER_SITE_OTHER): Payer: BLUE CROSS/BLUE SHIELD | Admitting: Family Medicine

## 2015-01-14 ENCOUNTER — Encounter: Payer: Self-pay | Admitting: Family Medicine

## 2015-01-14 VITALS — BP 120/78 | HR 85 | Temp 98.2°F | Ht 69.0 in | Wt 284.5 lb

## 2015-01-14 DIAGNOSIS — M5441 Lumbago with sciatica, right side: Secondary | ICD-10-CM | POA: Diagnosis not present

## 2015-01-14 DIAGNOSIS — M5416 Radiculopathy, lumbar region: Secondary | ICD-10-CM

## 2015-01-14 MED ORDER — PREDNISONE 20 MG PO TABS: ORAL_TABLET | ORAL | Status: DC

## 2015-01-14 NOTE — Progress Notes (Signed)
Dr. Karleen Hampshire T. Eleny Cortez, MD, CAQ Sports Medicine [facility] [address] [place], [address] Phone: [phone] Fax: [phone]  01/14/2015  Patient: Amber [name], MRN: [id number], DOB: 08/18/1985, 29 y.o.  Primary Physician:  Hannah Beat, MD   Chief Complaint  Patient presents with  . Follow-up    [facility] ED   Subjective:   Amber [name] is a 29 y.o. very pleasant female patient who presents with the following: Back Pain  ongoing for approximately: 1 week The patient has had back pain before. The back pain is localized into the lumbar spine area. They also describe R anterior radiculopathy.  Able to walk now. Going down the R front leg - all the way to the front of foot.  Thigh was feeling weak  Worst than childbirth pain, 10/10.  5/10 - worst in butt bone and top of buttocks.  12/21 and 12/23 ER visits requiring narcotics and valium, toradol 12/22. Dr Dayton Martes saw and gave IM decadron 10 mg and oral prednisone  No numbness or tingling. No bowel or bladder incontinence. No focal weakness - she did have some thigh weakness that is gone now.  Prior interventions: none Physical therapy: No Chiropractic manipulations: No Acupuncture: No Osteopathic manipulation: No Heat or cold: Minimal effect  Past Medical History, Surgical History, Family History, Medications, Allergies have been reviewed and updated if relevant.  Patient Active Problem List   Diagnosis Date Noted  . Right low back pain 01/08/2015  . Acute left flank pain 07/22/2014  . RUQ PAIN 01/06/2010  . HEARTBURN 08/14/2009  . ANXIETY STATE, UNSPECIFIED 03/27/2009  . PAROXYSMAL TACHYCARDIA 03/27/2009  . FRACTURE, TIBIAL PLATEAU 09/21/2008  . ANEMIA-IRON DEFICIENCY 04/17/2008  . GERD 04/17/2008  . OBESITY 12/04/2006  . Obstructive sleep apnea (adult) (pediatric) 12/04/2006  . TEMPOROMANDIBULAR JOINT PAIN 12/04/2006  . MIGRAINE HEADACHE 10/05/2006    Past Medical History   Diagnosis Date  . Headache(784.0)   . Morbid obesity (HCC)   . Palpitations   . Anemia     iron deficiency  . GERD (gastroesophageal reflux disease)   . Stress fracture     Medial tibial plateau; mild medial retinacular sprain and PF lig sprain     Past Surgical History  Procedure Laterality Date  . Tubes in ears      multiple     Social History   Social History  . Marital Status: Married    Spouse Name: N/A  . Number of Children: N/A  . Years of Education: N/A   Occupational History  . Not on file.   Social History Main Topics  . Smoking status: Never Smoker   . Smokeless tobacco: Never Used  . Alcohol Use: No  . Drug Use: No  . Sexual Activity: Not on file   Other Topics Concern  . Not on file   Social History Narrative   Married; works as a Pharmacologist at [employer], [employer].     Family History  Problem Relation Age of Onset  . Hypertension      family hx     Allergies  Allergen Reactions  . Dilaudid [Hydromorphone Hcl] Other (See Comments)    Hallucinations  . Latex Rash    Medication list reviewed and updated in full in [facility].  GEN: No fevers, chills. Nontoxic. Primarily MSK c/o today. MSK: Detailed in the HPI GI: tolerating PO intake without difficulty Neuro: As above  Otherwise the pertinent positives of the ROS  are noted above.    Objective:   Blood pressure 120/78, pulse 85, temperature 98.2 F (36.8 C), temperature source Oral, height  (1.753 m), weight 284 lb 8 oz (129.048 kg), last menstrual period 01/05/2015.  Gen: Well-developed,well-nourished,in no acute distress; alert,appropriate and cooperative throughout examination HEENT: Normocephalic and atraumatic without obvious abnormalities.  Ears, externally no deformities Pulm: Breathing comfortably in no respiratory distress Range of motion at  the waist: Flexion, rotation and lateral bending: flexion limited to 80, mildly limited ext. Lateral bending  preserved  No echymosis or edema Rises to examination table with no difficulty Gait: minimally antalgic  Inspection/Deformity: No abnormality Paraspinus T:  l4-s1  B Ankle Dorsiflexion (L5,4): 5/5 B Great Toe Dorsiflexion (L5,4): 5/5 Heel Walk (L5): WNL Toe Walk (S1): WNL Rise/Squat (L4): WNL, mild pain  SENSORY B Medial Foot (L4): WNL B Dorsum (L5): WNL B Lateral (S1): WNL Light Touch: WNL Pinprick: WNL  REFLEXES Knee (L4): 2+ Ankle (S1): 2+  B SLR, seated: neg B SLR, supine: neg B FABER: pain posterior r B Reverse FABER: neg B Greater Troch: NT B Log Roll: neg B Stork: NT B Sciatic Notch: TTP on the R  Radiology: Dg Lumbar Spine 2-3 Views  01/07/2015  CLINICAL DATA:  Lumbago for 1 day.  No known injury EXAM: LUMBAR SPINE - 2-3 VIEW COMPARISON:  None. FINDINGS: Frontal, lateral, and spot lumbosacral lateral images were obtained. There are 5 non-rib-bearing lumbar type vertebral bodies. There is no fracture or spondylolisthesis. Disc spaces appear normal. There are small anterior osteophytes at L3 and L4. No erosive change. There is an intrauterine device in mid pelvis. IMPRESSION: No appreciable disc space narrowing. No fracture or spondylolisthesis. Intrauterine device in mid pelvis. Electronically Signed   By: Bretta Bang III M.D.   On: 01/07/2015 09:40    Assessment and Plan:   Right lumbar radiculopathy  Right-sided low back pain with right-sided sciatica  Probable R sided disc herniation. Now improved to 4-5/10 pain, doing much better and moving better.  No numbness or weakness.  I am going to extend prednisone course.   She will check in with me in 2 weeks.  Follow-up: No Follow-up on file.  New Prescriptions   No medications on file   Modified Medications   Modified Medication Previous Medication   PREDNISONE (DELTASONE) 20 MG TABLET predniSONE (DELTASONE) 20 MG tablet      2 tabs po for 4 days, then 1 tab po for 5 days    2 tabs by mouth  daily x 5 days   No orders of the defined types were placed in this encounter.    Signed,  Elpidio Galea. Filemon Breton, MD   Patient's Medications  New Prescriptions   No medications on file  Previous Medications   CYCLOBENZAPRINE (FLEXERIL) 5 MG TABLET    Take 1-2 tablets (5-10 mg total) by mouth at bedtime.   DIAZEPAM (VALIUM) 5 MG TABLET    Take 5 mg by mouth every 6 (six) hours as needed for anxiety.   DICLOFENAC (VOLTAREN) 75 MG EC TABLET    Take 1 tablet (75 mg total) by mouth 2 (two) times daily.   KETOROLAC (TORADOL) 10 MG TABLET    Take 1 tablet (10 mg total) by mouth every 6 (six) hours as needed.   LEVONORGESTREL (MIRENA) 20 MCG/24HR IUD    1 each by Intrauterine route once.   OXYCODONE-ACETAMINOPHEN (PERCOCET) 7.5-325 MG TABLET    Take 1 tablet by mouth every 6 (six)  hours as needed for moderate pain or severe pain.   SUMATRIPTAN (IMITREX) 50 MG TABLET    Take 50 mg by mouth every 2 (two) hours as needed for migraine. May repeat in 2 hours if headache persists or recurs.   TRAMADOL (ULTRAM) 50 MG TABLET    Take 1 tablet (50 mg total) by mouth every 8 (eight) hours as needed for moderate pain or severe pain.  Modified Medications   Modified Medication Previous Medication   PREDNISONE (DELTASONE) 20 MG TABLET predniSONE (DELTASONE) 20 MG tablet      2 tabs po for 4 days, then 1 tab po for 5 days    2 tabs by mouth daily x 5 days  Discontinued Medications   No medications on file

## 2015-01-14 NOTE — Progress Notes (Signed)
Pre visit review using our clinic review tool, if applicable. No additional management support is needed unless otherwise documented below in the visit note. 

## 2015-01-26 ENCOUNTER — Telehealth: Payer: Self-pay | Admitting: Family Medicine

## 2015-01-26 ENCOUNTER — Other Ambulatory Visit: Payer: Self-pay | Admitting: Family Medicine

## 2015-01-26 NOTE — Telephone Encounter (Signed)
Pt mom called. Pt is at work and could not call. Pt took last prednisone pill wed or thur of last week. Back is still hurting bad. What is the next step? Please advise

## 2015-01-26 NOTE — Telephone Encounter (Signed)
Clarify with the patient herself.   Last time we spoke she was doing better and had much improved pain symptoms - has she gotten worse. I need further clarification from the patient - not her mom.

## 2015-01-27 NOTE — Telephone Encounter (Signed)
i think that is reasonable - if still not doing well after this, then f/u with me in the office  Prednisone 20 mg, 2 tabs po for 5 days, then 1 tab po for 5 days, #15, 0 ref

## 2015-01-27 NOTE — Telephone Encounter (Signed)
Spoke with Delta Air Linesichole.  She states she took her last Prednisone last Thursday and the prednisone really helped her back.  On Saturday she was sitting in her bed doing a lot of work on her laptop and she started hurting again in the middle of her back. She still has flexeril which she took and that did seem to help.  She states her back is not currently hurting as bad as it was on Saturday but is still hurting. She didn't know if Dr. Patsy Lageropland would want to give her another course of Prednisone.  Please advise.

## 2015-01-27 NOTE — Telephone Encounter (Signed)
Left message for Amber [name] to return my call.

## 2015-01-28 MED ORDER — PREDNISONE 20 MG PO TABS: ORAL_TABLET | ORAL | Status: DC

## 2015-01-28 NOTE — Telephone Encounter (Signed)
Left message for Bryttney that Dr. Patsy Lageropland is going to give her another course of prednisone and [facility] has [facility] [address] [place].

## 2015-02-04 ENCOUNTER — Telehealth: Payer: Self-pay

## 2015-02-04 NOTE — Telephone Encounter (Signed)
Pt left v/m requesting pharmacy profile be updated and pts pharmacy [facility] is [facility]. Done.

## 2015-03-10 ENCOUNTER — Other Ambulatory Visit: Payer: Self-pay | Admitting: Family Medicine

## 2015-03-10 ENCOUNTER — Encounter: Payer: Self-pay | Admitting: Family Medicine

## 2015-03-10 ENCOUNTER — Ambulatory Visit (INDEPENDENT_AMBULATORY_CARE_PROVIDER_SITE_OTHER)
Admission: RE | Admit: 2015-03-10 | Discharge: 2015-03-10 | Disposition: A | Discharge status: Home / Self Care | Payer: BLUE CROSS/BLUE SHIELD | Source: Ambulatory Visit | Attending: Family Medicine | Admitting: Family Medicine

## 2015-03-10 ENCOUNTER — Ambulatory Visit (INDEPENDENT_AMBULATORY_CARE_PROVIDER_SITE_OTHER): Payer: BLUE CROSS/BLUE SHIELD | Admitting: Family Medicine

## 2015-03-10 ENCOUNTER — Telehealth: Payer: Self-pay

## 2015-03-10 VITALS — BP 116/70 | HR 78 | Temp 97.4°F | Ht 69.0 in | Wt 286.0 lb

## 2015-03-10 DIAGNOSIS — M79675 Pain in left toe(s): Secondary | ICD-10-CM

## 2015-03-10 DIAGNOSIS — G43009 Migraine without aura, not intractable, without status migrainosus: Secondary | ICD-10-CM | POA: Diagnosis not present

## 2015-03-10 DIAGNOSIS — M79676 Pain in unspecified toe(s): Secondary | ICD-10-CM | POA: Insufficient documentation

## 2015-03-10 LAB — URIC ACID: Uric Acid, Serum: 5.3 mg/dL (ref 2.4–7.0)

## 2015-03-10 MED ORDER — COLCHICINE 0.6 MG PO TABS: ORAL_TABLET | ORAL | Status: DC

## 2015-03-10 MED ORDER — SUMATRIPTAN SUCCINATE 50 MG PO TABS: 50.0000 mg | ORAL_TABLET | ORAL | Status: DC | PRN

## 2015-03-10 MED ORDER — INDOMETHACIN 50 MG PO CAPS: 50.0000 mg | ORAL_CAPSULE | Freq: Two times a day (BID) | ORAL | Status: DC

## 2015-03-10 NOTE — Telephone Encounter (Signed)
As uric acid is now back and normal. Gout not as likely. Recommend  Her to come in for X ray of toe today.   HAve her use ibuprofen 800 mg every 8 hours until then.

## 2015-03-10 NOTE — Patient Instructions (Signed)
Stop at lab on way out. We will call with results today.  Start colchicine 2 tabs x 1 then repeat 1-2 hours later.  The following day take 1 tab twice daily until pain is resolved.  Call if pain is not improving significantly after the first 2-3 days.  Gout Gout is an inflammatory arthritis caused by a buildup of uric acid crystals in the joints. Uric acid is a chemical that is normally present in the blood. When the level of uric acid in the blood is too high it can form crystals that deposit in your joints and tissues. This causes joint redness, soreness, and swelling (inflammation). Repeat attacks are common. Over time, uric acid crystals can form into masses (tophi) near a joint, destroying bone and causing disfigurement. Gout is treatable and often preventable. CAUSES  The disease begins with elevated levels of uric acid in the blood. Uric acid is produced by your body when it breaks down a naturally found substance called purines. Certain foods you eat, such as meats and fish, contain high amounts of purines. Causes of an elevated uric acid level include:  Being passed down from parent to child (heredity).  Diseases that cause increased uric acid production (such as obesity, psoriasis, and certain cancers).  Excessive alcohol use.  Diet, especially diets rich in meat and seafood.  Medicines, including certain cancer-fighting medicines (chemotherapy), water pills (diuretics), and aspirin.  Chronic kidney disease. The kidneys are no longer able to remove uric acid well.  Problems with metabolism. Conditions strongly associated with gout include:  Obesity.  High blood pressure.  High cholesterol.  Diabetes. Not everyone with elevated uric acid levels gets gout. It is not understood why some people get gout and others do not. Surgery, joint injury, and eating too much of certain foods are some of the factors that can lead to gout attacks. SYMPTOMS   An attack of gout comes on  quickly. It causes intense pain with redness, swelling, and warmth in a joint.  Fever can occur.  Often, only one joint is involved. Certain joints are more commonly involved:  Base of the big toe.  Knee.  Ankle.  Wrist.  Finger. Without treatment, an attack usually goes away in a few days to weeks. Between attacks, you usually will not have symptoms, which is different from many other forms of arthritis. DIAGNOSIS  Your caregiver will suspect gout based on your symptoms and exam. In some cases, tests may be recommended. The tests may include:  Blood tests.  Urine tests.  X-rays.  Joint fluid exam. This exam requires a needle to remove fluid from the joint (arthrocentesis). Using a microscope, gout is confirmed when uric acid crystals are seen in the joint fluid. TREATMENT  There are two phases to gout treatment: treating the sudden onset (acute) attack and preventing attacks (prophylaxis).  Treatment of an Acute Attack.  Medicines are used. These include anti-inflammatory medicines or steroid medicines.  An injection of steroid medicine into the affected joint is sometimes necessary.  The painful joint is rested. Movement can worsen the arthritis.  You may use warm or cold treatments on painful joints, depending which works best for you.  Treatment to Prevent Attacks.  If you suffer from frequent gout attacks, your caregiver may advise preventive medicine. These medicines are started after the acute attack subsides. These medicines either help your kidneys eliminate uric acid from your body or decrease your uric acid production. You may need to stay on these medicines for  a very long time.  The early phase of treatment with preventive medicine can be associated with an increase in acute gout attacks. For this reason, during the first few months of treatment, your caregiver may also advise you to take medicines usually used for acute gout treatment. Be sure you understand  your caregiver's directions. Your caregiver may make several adjustments to your medicine dose before these medicines are effective.  Discuss dietary treatment with your caregiver or dietitian. Alcohol and drinks high in sugar and fructose and foods such as meat, poultry, and seafood can increase uric acid levels. Your caregiver or dietitian can advise you on drinks and foods that should be limited. HOME CARE INSTRUCTIONS   Do not take aspirin to relieve pain. This raises uric acid levels.  Only take over-the-counter or prescription medicines for pain, discomfort, or fever as directed by your caregiver.  Rest the joint as much as possible. When in bed, keep sheets and blankets off painful areas.  Keep the affected joint raised (elevated).  Apply warm or cold treatments to painful joints. Use of warm or cold treatments depends on which works best for you.  Use crutches if the painful joint is in your leg.  Drink enough fluids to keep your urine clear or pale yellow. This helps your body get rid of uric acid. Limit alcohol, sugary drinks, and fructose drinks.  Follow your dietary instructions. Pay careful attention to the amount of protein you eat. Your daily diet should emphasize fruits, vegetables, whole grains, and fat-free or low-fat milk products. Discuss the use of coffee, vitamin C, and cherries with your caregiver or dietitian. These may be helpful in lowering uric acid levels.  Maintain a healthy body weight. SEEK MEDICAL CARE IF:   You develop diarrhea, vomiting, or any side effects from medicines.  You do not feel better in 24 hours, or you are getting worse. SEEK IMMEDIATE MEDICAL CARE IF:   Your joint becomes suddenly more tender, and you have chills or a fever. MAKE SURE YOU:   Understand these instructions.  Will watch your condition.  Will get help right away if you are not doing well or get worse.   This information is not intended to replace advice given to you  by your health care provider. Make sure you discuss any questions you have with your health care provider.   Document Released: 01/01/2000 Document Revised: 01/24/2014 Document Reviewed: 08/17/2011 Elsevier Interactive Patient Education [employer]

## 2015-03-10 NOTE — Progress Notes (Signed)
Pre visit review using our clinic review tool, if applicable. No additional management support is needed unless otherwise documented below in the visit note. 

## 2015-03-10 NOTE — Telephone Encounter (Signed)
Pt left v/m; pt was seen earlier today; the colchicine cost to pt with ins is $ 130.00 and pt cannot afford that; pt request different [facility].Please advise.

## 2015-03-10 NOTE — Progress Notes (Signed)
   Subjective:    Patient ID: Amber [name], female    DOB: 15-Jul-1985, 30 y.o.   MRN: [id number]  HPI  30 year old obese female  presents with new onset pain in  left great  Toe. She has swelling and redness in base of great toe x3 weeks, now in last she has been having constant severe shooting pain in left toe base of toes. Burning in base of great toe.  She tried some leftover prednisone 20 mg x x 2 days and iced foot.  Helped a little.  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  No known injury, no known falls.  Eats a lot of black beans. No ETOH. No new meds.  Not on diuretic.   She has been having a lot of migraine headaches lately. Needs refill of imitrex. [facility]en [facility] in past. No personal history of gout. No family history of gout. Body mass index is 42.22 kg/(m^2).  Social History /Family History/Past Medical History reviewed and updated if needed.    Review of Systems  Constitutional: Negative for fever and fatigue.  HENT: Negative for ear pain.   Eyes: Negative for pain.  Respiratory: Negative for chest tightness and shortness of breath.   Cardiovascular: Negative for chest pain, palpitations and leg swelling.  Gastrointestinal: Negative for abdominal pain.  Genitourinary: Negative for dysuria.  Neurological: Positive for headaches.       Objective:   Physical Exam  Constitutional: Vital signs are normal. She appears well-developed and well-nourished. She is cooperative.  Non-toxic appearance. She does not appear ill. No distress.  HENT:  Head: Normocephalic.  Right Ear: Hearing, tympanic membrane, external ear and ear canal normal. Tympanic membrane is not erythematous, not retracted and not bulging.  Left Ear: Hearing, tympanic membrane, external ear and ear canal normal. Tympanic membrane is not erythematous, not retracted and not bulging.  Nose: No mucosal edema or rhinorrhea. Right sinus exhibits no maxillary sinus tenderness  and no frontal sinus tenderness. Left sinus exhibits no maxillary sinus tenderness and no frontal sinus tenderness.  Mouth/Throat: Uvula is midline, oropharynx is clear and moist and mucous membranes are normal.  Eyes: Conjunctivae, EOM and lids are normal. Pupils are equal, round, and reactive to light. Lids are everted and swept, no foreign bodies found.  Neck: Trachea normal and normal range of motion. Neck supple. Carotid bruit is not present. No thyroid mass and no thyromegaly present.  Cardiovascular: Normal rate, regular rhythm, S1 normal, S2 normal, normal heart sounds, intact distal pulses and normal pulses.  Exam reveals no gallop and no friction rub.   No murmur heard. Pulmonary/Chest: Effort normal and breath sounds normal. No tachypnea. No respiratory distress. She has no decreased breath sounds. She has no wheezes. She has no rhonchi. She has no rales.  Abdominal: Soft. Normal appearance and bowel sounds are normal. There is no tenderness.  Musculoskeletal:  Left great toe with erythema, swelling and heat at base, very tender to slight touch, decreased ROM of left great toe.  Neurological: She is alert.  Skin: Skin is warm, dry and intact. No rash noted.  Psychiatric: Her speech is normal and behavior is normal. Judgment and thought content normal. Her mood appears not anxious. Cognition and memory are normal. She does not exhibit a depressed mood.          Assessment & Plan:

## 2015-03-10 NOTE — Telephone Encounter (Signed)
Pt returned CMA call / lt  Best number to call is [phone] (work number)

## 2015-03-10 NOTE — Assessment & Plan Note (Signed)
Appears most consistent for gout flare, new onset.  No clear fracture but if uric acid neg consider X-ray.  Start colchicine empirically, but it may be to late in flare and require steroid course.

## 2015-03-10 NOTE — Assessment & Plan Note (Signed)
Refilled migraine medication

## 2015-03-10 NOTE — Telephone Encounter (Signed)
Amber [name] notified as instructed by telephone.  She will try to come in today or tomorrow for x-ray.

## 2015-07-09 ENCOUNTER — Other Ambulatory Visit: Payer: Self-pay | Admitting: *Deleted

## 2015-07-09 MED ORDER — PREDNISONE 20 MG PO TABS: ORAL_TABLET | ORAL | Status: DC

## 2015-07-09 NOTE — Telephone Encounter (Signed)
Received fax from [facility] requesting refill on Prednisone.  Fax states patient's back is bothering her again and wants to know if she can get a refill without being seen.

## 2016-01-12 ENCOUNTER — Other Ambulatory Visit: Payer: Self-pay | Admitting: Family Medicine

## 2016-02-29 ENCOUNTER — Other Ambulatory Visit: Payer: Self-pay | Admitting: Family Medicine

## 2016-02-29 NOTE — Telephone Encounter (Signed)
Last office visit 03/10/2015 with Dr. Mirian MoBesdole.  Last refilled 01/26/2015 for #50 with 1 refill.  Ok to refill?

## 2016-03-01 NOTE — Telephone Encounter (Signed)
Ok, 50, 1 refill

## 2016-05-11 ENCOUNTER — Encounter: Payer: Self-pay | Admitting: Gastroenterology

## 2016-05-17 ENCOUNTER — Encounter (INDEPENDENT_AMBULATORY_CARE_PROVIDER_SITE_OTHER): Payer: Self-pay

## 2016-05-17 ENCOUNTER — Ambulatory Visit (INDEPENDENT_AMBULATORY_CARE_PROVIDER_SITE_OTHER): Payer: BLUE CROSS/BLUE SHIELD | Admitting: Gastroenterology

## 2016-05-17 ENCOUNTER — Encounter: Payer: Self-pay | Admitting: Gastroenterology

## 2016-05-17 VITALS — BP 120/76 | Ht 69.0 in | Wt 303.0 lb

## 2016-05-17 DIAGNOSIS — K219 Gastro-esophageal reflux disease without esophagitis: Secondary | ICD-10-CM | POA: Diagnosis not present

## 2016-05-17 DIAGNOSIS — R1013 Epigastric pain: Secondary | ICD-10-CM

## 2016-05-17 DIAGNOSIS — R1011 Right upper quadrant pain: Secondary | ICD-10-CM

## 2016-05-17 DIAGNOSIS — R0789 Other chest pain: Secondary | ICD-10-CM

## 2016-05-17 MED ORDER — PANTOPRAZOLE SODIUM 40 MG PO TBEC: 40.0000 mg | DELAYED_RELEASE_TABLET | Freq: Every day | ORAL | 2 refills | Status: DC

## 2016-05-17 MED ORDER — PANTOPRAZOLE SODIUM 40 MG PO TBEC: 40.0000 mg | DELAYED_RELEASE_TABLET | Freq: Two times a day (BID) | ORAL | 2 refills | Status: DC

## 2016-05-17 NOTE — Patient Instructions (Signed)
You have been scheduled for an abdominal ultrasound at [facility]) on 05-20-16 at 8 am. Please arrive 15 minutes prior to your appointment for registration. Make certain not to have anything to eat or drink 6 hours prior to your appointment. Should you need to reschedule your appointment, please contact radiology at [phone]. This test typically takes about 30 minutes to perform.  We have sent the following medications to your pharmacy for you to pick up at your convenience: Pantoprazole 40 mg twice a day

## 2016-05-17 NOTE — Progress Notes (Signed)
05/17/2016 TAUHEEDAH BOK [id number] 25-Jun-1985   HISTORY OF PRESENT ILLNESS:  This is a pleasant 31 year old female who is known to Dr. Christella Hartigan for GERD. She had an EGD in August 2011 that showed very mild reflux esophagitis with a small hiatal hernia. She has been on omeprazole 20 mg twice daily. She presents to our office today with complaints of epigastric abdominal pain and pain up into her chest and under her rib cage, on both sides, but primarily the right. She says that these issues have been present for the past few months, but much worse over the past 2 weeks. She says that this pain wakes her up from sleep in the early morning. Sometimes takes her breath away. The pain in her epigastrium is present constantly, but the pain up into her chest seems to come and go. She complains of a lot of nausea she describes it as feeling as if it is a bad cramp in her chest and her upper stomach. She is unsure if this is related to her reflux. She was also concern about gallbladder issues.   Past Medical History:  Diagnosis Date  . Anemia    iron deficiency  . GERD (gastroesophageal reflux disease)   . Headache(784.0)   . Morbid obesity (HCC)   . Palpitations   . Stress fracture    Medial tibial plateau; mild medial retinacular sprain and PF lig sprain    Past Surgical History:  Procedure Laterality Date  . tubes in ears     multiple     reports that she has never smoked. She has never used smokeless tobacco. She reports that she does not drink alcohol or use drugs. family history includes Colon polyps in her father. Allergies  Allergen Reactions  . Dilaudid [Hydromorphone Hcl] Other (See Comments)    Hallucinations  . Latex Rash      Outpatient Encounter Prescriptions as of 05/17/2016  Medication Sig  . cyclobenzaprine (FLEXERIL) 5 MG tablet TAKE 1-2 TABLETS BY MOUTH AT BEDTIME AS NEEDED  . levonorgestrel (MIRENA) 20 MCG/24HR IUD 1 each by Intrauterine route once.  Marland Kitchen omeprazole  (PRILOSEC) 20 MG capsule Take 20 mg by mouth 2 (two) times daily before a meal.  . predniSONE (DELTASONE) 20 MG tablet TAKE 2 TABS FOR 5 DAYS  THEN 1 TAB FOR 5DAYS  . SUMAtriptan (IMITREX) 50 MG tablet Take 1 tablet (50 mg total) by mouth every 2 (two) hours as needed for migraine. May repeat in 2 hours if headache persists or recurs.  . [DISCONTINUED] diazepam (VALIUM) 5 MG tablet Take 5 mg by mouth every 6 (six) hours as needed for anxiety.  . [DISCONTINUED] diclofenac (VOLTAREN) 75 MG EC tablet Take 1 tablet (75 mg total) by mouth 2 (two) times daily.  . [DISCONTINUED] indomethacin (INDOCIN) 50 MG capsule Take 1 capsule (50 mg total) by mouth 2 (two) times daily with a meal.  . [DISCONTINUED] ketorolac (TORADOL) 10 MG tablet Take 1 tablet (10 mg total) by mouth every 6 (six) hours as needed.  . [DISCONTINUED] oxyCODONE-acetaminophen (PERCOCET) 7.5-325 MG tablet Take 1 tablet by mouth every 6 (six) hours as needed for moderate pain or severe pain.  . [DISCONTINUED] traMADol (ULTRAM) 50 MG tablet Take 1 tablet (50 mg total) by mouth every 8 (eight) hours as needed for moderate pain or severe pain.   No facility-administered encounter medications on file as of 05/17/2016.      REVIEW OF SYSTEMS  : All other systems reviewed  and negative except where noted in the History of Present Illness.   PHYSICAL EXAM: BP 120/76   Ht  (1.753 m)   Wt (!) 303 lb (137.4 kg)   BMI 44.75 kg/m  General: Well developed white female in no acute distress Head: Normocephalic and atraumatic Eyes:  Sclerae anicteric, conjunctiva pink. Ears: Normal auditory acuity Lungs: Clear throughout to auscultation; no increased WOB. Heart: Regular rate and rhythm Abdomen: Soft, non-distended. Normal bowel sounds.  Moderate epigastric and RUQ TTP. Musculoskeletal: Symmetrical with no gross deformities  Skin: No lesions on visible extremities Extremities: No edema  Neurological: Alert oriented x 4, grossly  non-focal Psychological:  Alert and cooperative. Normal mood and affect  ASSESSMENT AND PLAN: -31 year old female with known acid reflux who is on PPI therapy and presents with upper abdominal pain and atypical chest pain. This could certainly all be related to acid reflux if it is being poorly controlled. ? esophageal spasm that is being caused by uncontrolled reflux. ? Gallbladder issues as they could certainly present this way.  I'm going to start by ordering an abdominal ultrasound. I'm going to put her on maximum dose of PPI therapy, and also change her PPI to pantoprazole 40 mg twice a day.  We will be in touch about the ultrasound results and then I would also like her to touch base with [place] in about 4 weeks with an update on her symptoms. At that time if she has had no improvement then could consider repeat EGD, ? pH study while on PPI, ? esophageal manometry.  CC:  Hannah Beat, MD

## 2016-05-19 NOTE — Progress Notes (Signed)
I agree with the above note, plan 

## 2016-05-20 ENCOUNTER — Ambulatory Visit
Admission: RE | Admit: 2016-05-20 | Discharge: 2016-05-20 | Disposition: A | Discharge status: Home / Self Care | Payer: BLUE CROSS/BLUE SHIELD | Source: Ambulatory Visit | Attending: Gastroenterology | Admitting: Gastroenterology

## 2016-05-20 DIAGNOSIS — R0789 Other chest pain: Secondary | ICD-10-CM | POA: Diagnosis not present

## 2016-05-20 DIAGNOSIS — K8020 Calculus of gallbladder without cholecystitis without obstruction: Secondary | ICD-10-CM | POA: Diagnosis not present

## 2016-05-20 DIAGNOSIS — R1011 Right upper quadrant pain: Secondary | ICD-10-CM | POA: Insufficient documentation

## 2016-05-20 DIAGNOSIS — K219 Gastro-esophageal reflux disease without esophagitis: Secondary | ICD-10-CM | POA: Insufficient documentation

## 2016-05-20 DIAGNOSIS — R1013 Epigastric pain: Secondary | ICD-10-CM | POA: Diagnosis not present

## 2016-05-23 ENCOUNTER — Other Ambulatory Visit: Payer: Self-pay

## 2016-05-23 DIAGNOSIS — K8020 Calculus of gallbladder without cholecystitis without obstruction: Secondary | ICD-10-CM

## 2016-06-02 DIAGNOSIS — K8010 Calculus of gallbladder with chronic cholecystitis without obstruction: Secondary | ICD-10-CM | POA: Diagnosis not present

## 2016-06-24 ENCOUNTER — Encounter (HOSPITAL_COMMUNITY): Payer: Self-pay | Admitting: *Deleted

## 2016-06-27 ENCOUNTER — Ambulatory Visit: Payer: Self-pay | Admitting: Surgery

## 2016-06-28 ENCOUNTER — Encounter (HOSPITAL_COMMUNITY): Payer: Self-pay

## 2016-06-28 NOTE — Patient Instructions (Signed)
Lynne Leaderichole M Astorga  06/28/2016   Your procedure is scheduled on: 07/05/2016    Report [facility] Main  Entrance Take [place]  elevators to 3rd floor to  Short Stay Center at    0530 AM.    Call this number if you have problems the morning of surgery [phone]    Remember: ONLY 1 PERSON MAY GO WITH YOU TO SHORT STAY TO GET  READY MORNING OF YOUR SURGERY.  Do not eat food or drink liquids :After Midnight.     Take these medicines the morning of surgery with A SIP OF WATER: Zyrtec, protonix                                 You may not have any metal on your body including hair pins and              piercings  Do not wear jewelry, make-up, lotions, powders or perfumes, deodorant             Do not wear nail polish.  Do not shave  48 hours prior to surgery.            .   Do not bring valuables to the hospital. [facility] IS NOT             RESPONSIBLE   FOR VALUABLES.  Contacts, dentures or bridgework may not be worn into surgery. .     Patients discharged the day of surgery will not be allowed to drive home.  Name and phone number of your driver:                Please read over the following fact sheets you were given: _____________________________________________________________________             [facility] - Preparing for Surgery Before surgery, you can play an important role.  Because skin is not sterile, your skin needs to be as free of germs as possible.  You can reduce the number of germs on your skin by washing with CHG (chlorahexidine gluconate) soap before surgery.  CHG is an antiseptic cleaner which kills germs and bonds with the skin to continue killing germs even after washing. Please DO NOT use if you have an allergy to CHG or antibacterial soaps.  If your skin becomes reddened/irritated stop using the CHG and inform your nurse when you arrive at Short Stay. Do not shave (including legs and underarms) for at least 48 hours prior to the  first CHG shower.  You may shave your face/neck. Please follow these instructions carefully:  1.  Shower with CHG Soap the night before surgery and the  morning of Surgery.  2.  If you choose to wash your hair, wash your hair first as usual with your  normal  shampoo.  3.  After you shampoo, rinse your hair and body thoroughly to remove the  shampoo.                           4.  Use CHG as you would any other liquid soap.  You can apply chg directly  to the skin and wash                       Gently with  a scrungie or clean washcloth.  5.  Apply the CHG Soap to your body ONLY FROM THE NECK DOWN.   Do not use on face/ open                           Wound or open sores. Avoid contact with eyes, ears mouth and genitals (private parts).                       Wash face,  Genitals (private parts) with your normal soap.             6.  Wash thoroughly, paying special attention to the area where your surgery  will be performed.  7.  Thoroughly rinse your body with warm water from the neck down.  8.  DO NOT shower/wash with your normal soap after using and rinsing off  the CHG Soap.                9.  Pat yourself dry with a clean towel.            10.  Wear clean pajamas.            11.  Place clean sheets on your bed the night of your first shower and do not  sleep with pets. Day of Surgery : Do not apply any lotions/deodorants the morning of surgery.  Please wear clean clothes to the hospital/surgery center.  FAILURE TO FOLLOW THESE INSTRUCTIONS MAY RESULT IN THE CANCELLATION OF YOUR SURGERY PATIENT SIGNATURE_________________________________  NURSE SIGNATURE__________________________________  ________________________________________________________________________

## 2016-06-30 ENCOUNTER — Encounter (HOSPITAL_COMMUNITY)
Admission: RE | Admit: 2016-06-30 | Discharge: 2016-06-30 | Disposition: A | Discharge status: Home / Self Care | Payer: BLUE CROSS/BLUE SHIELD | Source: Ambulatory Visit | Attending: Surgery | Admitting: Surgery

## 2016-06-30 ENCOUNTER — Encounter (INDEPENDENT_AMBULATORY_CARE_PROVIDER_SITE_OTHER): Payer: Self-pay

## 2016-06-30 ENCOUNTER — Encounter (HOSPITAL_COMMUNITY): Payer: Self-pay

## 2016-06-30 DIAGNOSIS — F411 Generalized anxiety disorder: Secondary | ICD-10-CM | POA: Diagnosis not present

## 2016-06-30 DIAGNOSIS — K219 Gastro-esophageal reflux disease without esophagitis: Secondary | ICD-10-CM | POA: Insufficient documentation

## 2016-06-30 DIAGNOSIS — G4733 Obstructive sleep apnea (adult) (pediatric): Secondary | ICD-10-CM | POA: Insufficient documentation

## 2016-06-30 DIAGNOSIS — D509 Iron deficiency anemia, unspecified: Secondary | ICD-10-CM | POA: Diagnosis not present

## 2016-06-30 DIAGNOSIS — E669 Obesity, unspecified: Secondary | ICD-10-CM | POA: Insufficient documentation

## 2016-06-30 DIAGNOSIS — I479 Paroxysmal tachycardia, unspecified: Secondary | ICD-10-CM | POA: Diagnosis not present

## 2016-06-30 DIAGNOSIS — Z01812 Encounter for preprocedural laboratory examination: Secondary | ICD-10-CM | POA: Insufficient documentation

## 2016-06-30 DIAGNOSIS — G43909 Migraine, unspecified, not intractable, without status migrainosus: Secondary | ICD-10-CM | POA: Insufficient documentation

## 2016-06-30 HISTORY — DX: Unspecified osteoarthritis, unspecified site: M19.90

## 2016-06-30 HISTORY — DX: Personal history of urinary calculi: Z87.442

## 2016-06-30 HISTORY — DX: Cardiac murmur, unspecified: R01.1

## 2016-06-30 HISTORY — DX: Family history of other specified conditions: Z84.89

## 2016-06-30 HISTORY — DX: Unspecified asthma, uncomplicated: J45.909

## 2016-06-30 LAB — CBC
HEMATOCRIT: 37 % (ref 36.0–46.0)
HEMOGLOBIN: 12 g/dL (ref 12.0–15.0)
MCH: 28.6 pg (ref 26.0–34.0)
MCHC: 32.4 g/dL (ref 30.0–36.0)
MCV: 88.1 fL (ref 78.0–100.0)
Platelets: 287 10*3/uL (ref 150–400)
RBC: 4.2 MIL/uL (ref 3.87–5.11)
RDW: 13.1 % (ref 11.5–15.5)
WBC: 7.3 10*3/uL (ref 4.0–10.5)

## 2016-06-30 LAB — HCG, SERUM, QUALITATIVE: Preg, Serum: NEGATIVE

## 2016-07-04 MED ORDER — DEXTROSE 5 % IV SOLN
3.0000 g | INTRAVENOUS | Status: AC
  Administered 2016-07-05: 3 g via INTRAVENOUS
  Filled 2016-07-04: qty 3000
  Filled 2016-07-04: qty 3

## 2016-07-05 ENCOUNTER — Ambulatory Visit: Payer: Self-pay | Admitting: Surgery

## 2016-07-05 ENCOUNTER — Ambulatory Visit (HOSPITAL_COMMUNITY): Payer: BLUE CROSS/BLUE SHIELD

## 2016-07-05 ENCOUNTER — Ambulatory Visit (HOSPITAL_COMMUNITY): Payer: BLUE CROSS/BLUE SHIELD | Admitting: Certified Registered Nurse Anesthetist

## 2016-07-05 ENCOUNTER — Encounter (HOSPITAL_COMMUNITY): Payer: Self-pay | Admitting: *Deleted

## 2016-07-05 ENCOUNTER — Encounter (HOSPITAL_COMMUNITY)
Admission: RE | Disposition: A | Discharge status: Home / Self Care | Payer: Self-pay | Source: Ambulatory Visit | Attending: Surgery

## 2016-07-05 ENCOUNTER — Ambulatory Visit (HOSPITAL_COMMUNITY)
Admission: RE | Admit: 2016-07-05 | Discharge: 2016-07-05 | Disposition: A | Discharge status: Home / Self Care | Payer: BLUE CROSS/BLUE SHIELD | Source: Ambulatory Visit | Attending: Surgery | Admitting: Surgery

## 2016-07-05 DIAGNOSIS — K8020 Calculus of gallbladder without cholecystitis without obstruction: Secondary | ICD-10-CM | POA: Diagnosis not present

## 2016-07-05 DIAGNOSIS — R1011 Right upper quadrant pain: Secondary | ICD-10-CM | POA: Diagnosis not present

## 2016-07-05 DIAGNOSIS — R12 Heartburn: Secondary | ICD-10-CM | POA: Diagnosis not present

## 2016-07-05 DIAGNOSIS — Z6841 Body Mass Index (BMI) 40.0 and over, adult: Secondary | ICD-10-CM | POA: Diagnosis not present

## 2016-07-05 DIAGNOSIS — K8010 Calculus of gallbladder with chronic cholecystitis without obstruction: Secondary | ICD-10-CM | POA: Insufficient documentation

## 2016-07-05 DIAGNOSIS — K219 Gastro-esophageal reflux disease without esophagitis: Secondary | ICD-10-CM | POA: Diagnosis not present

## 2016-07-05 HISTORY — PX: LAPAROSCOPIC CHOLECYSTECTOMY SINGLE SITE WITH INTRAOPERATIVE CHOLANGIOGRAM: SHX6538

## 2016-07-05 SURGERY — LAPAROSCOPIC CHOLECYSTECTOMY SINGLE SITE WITH INTRAOPERATIVE CHOLANGIOGRAM
Anesthesia: General

## 2016-07-05 MED ORDER — SODIUM CHLORIDE 0.9 % IR SOLN
Status: DC | PRN
  Administered 2016-07-05: 1

## 2016-07-05 MED ORDER — OXYCODONE HCL 5 MG PO TABS
5.0000 mg | ORAL_TABLET | ORAL | Status: DC | PRN
  Administered 2016-07-05: 5 mg via ORAL
  Filled 2016-07-05: qty 1

## 2016-07-05 MED ORDER — ACETAMINOPHEN 500 MG PO TABS
1000.0000 mg | ORAL_TABLET | ORAL | Status: AC
  Administered 2016-07-05: 1000 mg via ORAL
  Filled 2016-07-05: qty 2

## 2016-07-05 MED ORDER — PROPOFOL 10 MG/ML IV BOLUS
INTRAVENOUS | Status: DC | PRN
  Administered 2016-07-05: 300 mg via INTRAVENOUS

## 2016-07-05 MED ORDER — SCOPOLAMINE 1 MG/3DAYS TD PT72
MEDICATED_PATCH | TRANSDERMAL | Status: AC
  Filled 2016-07-05: qty 1

## 2016-07-05 MED ORDER — SCOPOLAMINE 1 MG/3DAYS TD PT72
MEDICATED_PATCH | TRANSDERMAL | Status: DC | PRN
  Administered 2016-07-05: 1 via TRANSDERMAL

## 2016-07-05 MED ORDER — EPHEDRINE 5 MG/ML INJ
INTRAVENOUS | Status: AC
  Filled 2016-07-05: qty 10

## 2016-07-05 MED ORDER — CHLORHEXIDINE GLUCONATE CLOTH 2 % EX PADS: 6.0000 | MEDICATED_PAD | Freq: Once | CUTANEOUS | Status: DC

## 2016-07-05 MED ORDER — IOPAMIDOL (ISOVUE-300) INJECTION 61%
INTRAVENOUS | Status: AC
  Filled 2016-07-05: qty 50

## 2016-07-05 MED ORDER — KETAMINE HCL 10 MG/ML IJ SOLN
INTRAMUSCULAR | Status: DC | PRN
Start: 2016-07-05 — End: 2016-07-05
  Administered 2016-07-05: 33 mg via INTRAVENOUS

## 2016-07-05 MED ORDER — METOCLOPRAMIDE HCL 5 MG/ML IJ SOLN: 10.0000 mg | Freq: Once | INTRAMUSCULAR | Status: DC | PRN

## 2016-07-05 MED ORDER — SODIUM CHLORIDE 0.9% FLUSH: 3.0000 mL | Freq: Two times a day (BID) | INTRAVENOUS | Status: DC

## 2016-07-05 MED ORDER — LIDOCAINE 2% (20 MG/ML) 5 ML SYRINGE
INTRAMUSCULAR | Status: DC | PRN
  Administered 2016-07-05: 1.5 mg/kg/h via INTRAVENOUS

## 2016-07-05 MED ORDER — DEXAMETHASONE SODIUM PHOSPHATE 10 MG/ML IJ SOLN
INTRAMUSCULAR | Status: DC | PRN
  Administered 2016-07-05: 6 mg via INTRAVENOUS

## 2016-07-05 MED ORDER — KETAMINE HCL 10 MG/ML IJ SOLN
INTRAMUSCULAR | Status: AC
  Filled 2016-07-05: qty 1

## 2016-07-05 MED ORDER — SODIUM CHLORIDE 0.9 % IV SOLN: 250.0000 mL | INTRAVENOUS | Status: DC | PRN

## 2016-07-05 MED ORDER — ROCURONIUM BROMIDE 50 MG/5ML IV SOSY
PREFILLED_SYRINGE | INTRAVENOUS | Status: DC | PRN
  Administered 2016-07-05: 10 mg via INTRAVENOUS
  Administered 2016-07-05: 40 mg via INTRAVENOUS

## 2016-07-05 MED ORDER — FENTANYL CITRATE (PF) 250 MCG/5ML IJ SOLN
INTRAMUSCULAR | Status: AC
  Filled 2016-07-05: qty 5

## 2016-07-05 MED ORDER — PROPOFOL 10 MG/ML IV BOLUS
INTRAVENOUS | Status: AC
  Filled 2016-07-05: qty 40

## 2016-07-05 MED ORDER — ROCURONIUM BROMIDE 50 MG/5ML IV SOSY
PREFILLED_SYRINGE | INTRAVENOUS | Status: AC
  Filled 2016-07-05: qty 5

## 2016-07-05 MED ORDER — FENTANYL CITRATE (PF) 100 MCG/2ML IJ SOLN
INTRAMUSCULAR | Status: AC
  Filled 2016-07-05: qty 2

## 2016-07-05 MED ORDER — SODIUM CHLORIDE 0.9% FLUSH
3.0000 mL | INTRAVENOUS | Status: DC | PRN
Start: 2016-07-05 — End: 2016-07-05

## 2016-07-05 MED ORDER — SUGAMMADEX SODIUM 500 MG/5ML IV SOLN
INTRAVENOUS | Status: AC
  Filled 2016-07-05: qty 5

## 2016-07-05 MED ORDER — LACTATED RINGERS IV SOLN
INTRAVENOUS | Status: DC | PRN
  Administered 2016-07-05 (×2): via INTRAVENOUS

## 2016-07-05 MED ORDER — GABAPENTIN 300 MG PO CAPS
300.0000 mg | ORAL_CAPSULE | ORAL | Status: AC
  Administered 2016-07-05: 300 mg via ORAL
  Filled 2016-07-05: qty 1

## 2016-07-05 MED ORDER — DEXAMETHASONE SODIUM PHOSPHATE 10 MG/ML IJ SOLN
INTRAMUSCULAR | Status: AC
  Filled 2016-07-05: qty 1

## 2016-07-05 MED ORDER — BUPIVACAINE LIPOSOME 1.3 % IJ SUSP
INTRAMUSCULAR | Status: DC | PRN
  Administered 2016-07-05: 20 mL

## 2016-07-05 MED ORDER — LIDOCAINE 2% (20 MG/ML) 5 ML SYRINGE
INTRAMUSCULAR | Status: AC
  Filled 2016-07-05: qty 5

## 2016-07-05 MED ORDER — HYDROCODONE-ACETAMINOPHEN 5-325 MG PO TABS: 1.0000 | ORAL_TABLET | ORAL | 0 refills | Status: DC | PRN

## 2016-07-05 MED ORDER — MIDAZOLAM HCL 2 MG/2ML IJ SOLN
INTRAMUSCULAR | Status: AC
  Filled 2016-07-05: qty 2

## 2016-07-05 MED ORDER — HEPARIN SODIUM (PORCINE) 5000 UNIT/ML IJ SOLN
5000.0000 [IU] | Freq: Once | INTRAMUSCULAR | Status: AC
  Administered 2016-07-05: 5000 [IU] via SUBCUTANEOUS
  Filled 2016-07-05: qty 1

## 2016-07-05 MED ORDER — SUGAMMADEX SODIUM 200 MG/2ML IV SOLN
INTRAVENOUS | Status: DC | PRN
  Administered 2016-07-05: 300 mg via INTRAVENOUS

## 2016-07-05 MED ORDER — FENTANYL CITRATE (PF) 100 MCG/2ML IJ SOLN
25.0000 ug | INTRAMUSCULAR | Status: DC | PRN
  Administered 2016-07-05 (×2): 50 ug via INTRAVENOUS

## 2016-07-05 MED ORDER — LIDOCAINE 2% (20 MG/ML) 5 ML SYRINGE
INTRAMUSCULAR | Status: AC
  Filled 2016-07-05: qty 10

## 2016-07-05 MED ORDER — ONDANSETRON HCL 4 MG/2ML IJ SOLN
INTRAMUSCULAR | Status: AC
  Filled 2016-07-05: qty 2

## 2016-07-05 MED ORDER — FENTANYL CITRATE (PF) 250 MCG/5ML IJ SOLN
INTRAMUSCULAR | Status: DC | PRN
  Administered 2016-07-05 (×2): 50 ug via INTRAVENOUS
  Administered 2016-07-05: 100 ug via INTRAVENOUS
  Administered 2016-07-05: 50 ug via INTRAVENOUS

## 2016-07-05 MED ORDER — BUPIVACAINE LIPOSOME 1.3 % IJ SUSP
20.0000 mL | Freq: Once | INTRAMUSCULAR | Status: DC
  Filled 2016-07-05: qty 20

## 2016-07-05 MED ORDER — SUCCINYLCHOLINE CHLORIDE 200 MG/10ML IV SOSY
PREFILLED_SYRINGE | INTRAVENOUS | Status: AC
  Filled 2016-07-05: qty 10

## 2016-07-05 MED ORDER — LACTATED RINGERS IV SOLN: INTRAVENOUS | Status: DC

## 2016-07-05 MED ORDER — MEPERIDINE HCL 50 MG/ML IJ SOLN: 6.2500 mg | INTRAMUSCULAR | Status: DC | PRN

## 2016-07-05 MED ORDER — ACETAMINOPHEN 325 MG PO TABS: 650.0000 mg | ORAL_TABLET | ORAL | Status: DC | PRN

## 2016-07-05 MED ORDER — ACETAMINOPHEN 650 MG RE SUPP
650.0000 mg | RECTAL | Status: DC | PRN
  Filled 2016-07-05: qty 1

## 2016-07-05 MED ORDER — 0.9 % SODIUM CHLORIDE (POUR BTL) OPTIME
TOPICAL | Status: DC | PRN
  Administered 2016-07-05: 1000 mL

## 2016-07-05 MED ORDER — PROPOFOL 10 MG/ML IV BOLUS
INTRAVENOUS | Status: AC
  Filled 2016-07-05: qty 20

## 2016-07-05 MED ORDER — ONDANSETRON HCL 4 MG/2ML IJ SOLN
INTRAMUSCULAR | Status: DC | PRN
  Administered 2016-07-05: 4 mg via INTRAVENOUS

## 2016-07-05 MED ORDER — EPHEDRINE SULFATE 50 MG/ML IJ SOLN
INTRAMUSCULAR | Status: DC | PRN
  Administered 2016-07-05: 5 mg via INTRAVENOUS

## 2016-07-05 MED ORDER — LIDOCAINE 2% (20 MG/ML) 5 ML SYRINGE
INTRAMUSCULAR | Status: DC | PRN
  Administered 2016-07-05: 100 mg via INTRAVENOUS

## 2016-07-05 MED ORDER — MIDAZOLAM HCL 5 MG/5ML IJ SOLN
INTRAMUSCULAR | Status: DC | PRN
  Administered 2016-07-05: 2 mg via INTRAVENOUS

## 2016-07-05 MED ORDER — SUCCINYLCHOLINE CHLORIDE 200 MG/10ML IV SOSY
PREFILLED_SYRINGE | INTRAVENOUS | Status: DC | PRN
  Administered 2016-07-05: 120 mg via INTRAVENOUS

## 2016-07-05 MED ORDER — CELECOXIB 200 MG PO CAPS
400.0000 mg | ORAL_CAPSULE | ORAL | Status: AC
  Administered 2016-07-05: 400 mg via ORAL
  Filled 2016-07-05: qty 2

## 2016-07-05 MED ORDER — IOPAMIDOL (ISOVUE-300) INJECTION 61%
INTRAVENOUS | Status: DC | PRN
  Administered 2016-07-05: 5 mL

## 2016-07-05 SURGICAL SUPPLY — 41 items
ADH SKN CLS APL DERMABOND .7 (GAUZE/BANDAGES/DRESSINGS) ×1
APL SKNCLS STERI-STRIP NONHPOA (GAUZE/BANDAGES/DRESSINGS)
APPLICATOR COTTON TIP 6IN STRL (MISCELLANEOUS) ×4 IMPLANT
APPLIER CLIP ROT 10 11.4 M/L (STAPLE) ×2
APR CLP MED LRG 11.4X10 (STAPLE) ×1
BAG SPEC RTRVL 10 TROC 200 (ENDOMECHANICALS)
BENZOIN TINCTURE PRP APPL 2/3 (GAUZE/BANDAGES/DRESSINGS) IMPLANT
CABLE HIGH FREQUENCY MONO STRZ (ELECTRODE) ×2 IMPLANT
CATH REDDICK CHOLANGI 4FR 50CM (CATHETERS) ×2 IMPLANT
CLIP APPLIE ROT 10 11.4 M/L (STAPLE) ×1 IMPLANT
COVER MAYO STAND STRL (DRAPES) ×2 IMPLANT
COVER SURGICAL LIGHT HANDLE (MISCELLANEOUS) ×2 IMPLANT
DECANTER SPIKE VIAL GLASS SM (MISCELLANEOUS) ×2 IMPLANT
DERMABOND ADVANCED (GAUZE/BANDAGES/DRESSINGS) ×1
DERMABOND ADVANCED .7 DNX12 (GAUZE/BANDAGES/DRESSINGS) ×1 IMPLANT
DEVICE TROCAR PUNCTURE CLOSURE (ENDOMECHANICALS) ×2 IMPLANT
DRAPE C-ARM 42X120 X-RAY (DRAPES) ×2 IMPLANT
ELECT PENCIL ROCKER SW 15FT (MISCELLANEOUS) IMPLANT
ELECT REM PT RETURN 15FT ADLT (MISCELLANEOUS) ×2 IMPLANT
GLOVE BIOGEL M 8.0 STRL (GLOVE) ×2 IMPLANT
GOWN STRL REUS W/TWL XL LVL3 (GOWN DISPOSABLE) ×6 IMPLANT
HEMOSTAT SURGICEL 4X8 (HEMOSTASIS) IMPLANT
IRRIG SUCT STRYKERFLOW 2 WTIP (MISCELLANEOUS) ×2
IRRIGATION SUCT STRKRFLW 2 WTP (MISCELLANEOUS) ×1 IMPLANT
IV CATH 14GX2 1/4 (CATHETERS) ×2 IMPLANT
KIT BASIN OR (CUSTOM PROCEDURE TRAY) ×2 IMPLANT
L-HOOK LAP DISP 36CM (ELECTROSURGICAL)
LHOOK LAP DISP 36CM (ELECTROSURGICAL) IMPLANT
POUCH RETRIEVAL ECOSAC 10 (ENDOMECHANICALS) IMPLANT
POUCH RETRIEVAL ECOSAC 10MM (ENDOMECHANICALS)
SCISSORS LAP 5X45 EPIX DISP (ENDOMECHANICALS) ×2 IMPLANT
SLEEVE XCEL OPT CAN 5 100 (ENDOMECHANICALS) ×6 IMPLANT
STRIP CLOSURE SKIN 1/2X4 (GAUZE/BANDAGES/DRESSINGS) IMPLANT
SUT MNCRL AB 4-0 PS2 18 (SUTURE) ×2 IMPLANT
SYR 20CC LL (SYRINGE) ×2 IMPLANT
TOWEL OR 17X26 10 PK STRL BLUE (TOWEL DISPOSABLE) ×2 IMPLANT
TRAY LAPAROSCOPIC (CUSTOM PROCEDURE TRAY) ×2 IMPLANT
TROCAR BLADELESS OPT 5 100 (ENDOMECHANICALS) ×2 IMPLANT
TROCAR XCEL BLUNT TIP 100MML (ENDOMECHANICALS) IMPLANT
TROCAR XCEL NON-BLD 11X100MML (ENDOMECHANICALS) ×2 IMPLANT
TUBING INSUF HEATED (TUBING) ×2 IMPLANT

## 2016-07-05 NOTE — Op Note (Signed)
Amber [name]   07/05/2016  9:06 AM  Procedure: Laparoscopic Cholecystectomy with intraoperative cholangiogram  Surgeon: Susy FrizzleMatt B. Daphine DeutscherMartin, MD, FACS Asst:  Phylliss Blakeshelsea Connor, MD  Anes:  General  Drains:  None  Findings:  chronic cholecystitis, cholecystolithiasis and normal IOC  Description of Procedure: The patient was taken to OR 2 and given general anesthesia.  The patient was prepped with Technicare and draped sterilely. A time out was performed.  Access to the abdomen was achieved with a 5 mm Optiview upsized to a 11 mm.  Port placement included three other 5 mm in the right upper quadrant and one in the umbilicus.    The gallbladder was visualized and the fundus was grasped and the gallbladder was elevated. Traction on the infundibulum allowed for successful demonstration of the critical view. Inflammatory changes were chronic with a thick walled whitish gallbladder.  The cystic duct was identified and clipped up on the gallbladder and an incision was made in the cystic duct and the Reddick catheter was inserted after milking the cystic duct of any debris. A dynamic cholangiogram was performed which demonstrated a long cystic duct, intrahepatic filling of the proper hepatic duct and distal fill of the common bile duct with flow into the duodenum.    The cystic duct was then triple clipped and divided, the cystic artery was double clipped and divided and then the gallbladder was removed from the gallbladder bed. Removal of the gallbladder from the gallbladder bed was performed without opening it.  The gallbladder was then placed in a bag and brought out through one of the trocar sites. The gallbladder bed was inspected and no bleeding or bile leaks were seen.   Laparoscopic visualization was used when closing fascial defects for the 11 mm trocar site.   Incisions were injected with Exparel and closed with 4-0 Vicryl and 4-0 monocryl on the skin.  Sponge and needle count were correct.    The  patient was taken to the recovery room in satisfactory condition.

## 2016-07-05 NOTE — Anesthesia Preprocedure Evaluation (Signed)
Anesthesia Evaluation  Patient identified by MRN, date of birth, ID band Patient awake    Reviewed: Allergy & Precautions, NPO status , Patient's Chart, lab work & pertinent test results  Airway Mallampati: II  TM Distance: >3 FB Neck ROM: Full    Dental no notable dental hx.    Pulmonary neg pulmonary ROS,    Pulmonary exam normal breath sounds clear to auscultation       Cardiovascular negative cardio ROS Normal cardiovascular exam Rhythm:Regular Rate:Normal     Neuro/Psych negative neurological ROS  negative psych ROS   GI/Hepatic negative GI ROS, Neg liver ROS,   Endo/Other  Morbid obesity  Renal/GU negative Renal ROS  negative genitourinary   Musculoskeletal negative musculoskeletal ROS (+)   Abdominal   Peds negative pediatric ROS (+)  Hematology negative hematology ROS (+)   Anesthesia Other Findings   Reproductive/Obstetrics negative OB ROS                             Anesthesia Physical Anesthesia Plan  ASA: III  Anesthesia Plan: General   Post-op Pain Management:    Induction: Intravenous  PONV Risk Score and Plan: 3 and Ondansetron, Dexamethasone, Propofol and Midazolam  Airway Management Planned: Oral ETT  Additional Equipment:   Intra-op Plan:   Post-operative Plan: Extubation in OR  Informed Consent: I have reviewed the patients History and Physical, chart, labs and discussed the procedure including the risks, benefits and alternatives for the proposed anesthesia with the patient or authorized representative who has indicated his/her understanding and acceptance.   Dental advisory given  Plan Discussed with: CRNA  Anesthesia Plan Comments:         Anesthesia Quick Evaluation

## 2016-07-05 NOTE — Interval H&P Note (Signed)
History and Physical Interval Note:  07/05/2016 7:24 AM  Amber [name]  has presented today for surgery, with the diagnosis of CHRONIC CHOLELITHIASIS  The various methods of treatment have been discussed with the patient and family. After consideration of risks, benefits and other options for treatment, the patient has consented to  Procedure(s): LAPAROSCOPIC CHOLECYSTECTOMY  WITH INTRAOPERATIVE CHOLANGIOGRAM (N/A) as a surgical intervention .  The patient's history has been reviewed, patient examined, no change in status, stable for surgery.  I have reviewed the patient's chart and labs.  Questions were answered to the patient's satisfaction.     Gena Laski B

## 2016-07-05 NOTE — Transfer of Care (Signed)
Immediate Anesthesia Transfer of Care Note  Patient: Amber [name]  Procedure(s) Performed: Procedure(s): LAPAROSCOPIC CHOLECYSTECTOMY  WITH INTRAOPERATIVE CHOLANGIOGRAM (N/A)  Patient Location: PACU  Anesthesia Type:General  Level of Consciousness:  sedated, patient cooperative and responds to stimulation  Airway & Oxygen Therapy:Patient Spontanous Breathing and Patient connected to face mask oxgen  Post-op Assessment:  Report given to PACU RN and Post -op Vital signs reviewed and stable  Post vital signs:  Reviewed and stable  Last Vitals:  Vitals:   07/05/16 0537  BP: 132/83  Pulse: 81  Resp: 18  Temp: 36.8 C    Complications: No apparent anesthesia complications

## 2016-07-05 NOTE — Anesthesia Postprocedure Evaluation (Signed)
Anesthesia Post Note  Patient: Amber [name]  Procedure(s) Performed: Procedure(s) (LRB): LAPAROSCOPIC CHOLECYSTECTOMY  WITH INTRAOPERATIVE CHOLANGIOGRAM (N/A)     Patient location during evaluation: PACU Anesthesia Type: General Level of consciousness: awake and alert Pain management: pain level controlled Vital Signs Assessment: post-procedure vital signs reviewed and stable Respiratory status: spontaneous breathing, nonlabored ventilation, respiratory function stable and patient connected to nasal cannula oxygen Cardiovascular status: blood pressure returned to baseline and stable Postop Assessment: no signs of nausea or vomiting Anesthetic complications: no    Last Vitals:  Vitals:   07/05/16 1027 07/05/16 1130  BP: 132/89 129/82  Pulse: 67 71  Resp: 16 16  Temp: 36.8 C 36.6 C    Last Pain:  Vitals:   07/05/16 1130  TempSrc: Oral  PainSc: 3                  Phillips Groutarignan, Nahiara Kretzschmar

## 2016-07-05 NOTE — H&P (Signed)
Lynne Leaderichole M Dearmond 06/02/2016 3:32 PM Location: [place] Office Patient #: [id number] DOB: August 11, 1985 Married / Language: [place] / Race: White Female   History of Present Illness Molli Hazard(Karmela Bram B. Daphine DeutscherMartin MD; 06/02/2016 3:58 PM) Patient words: New-Gallbladder.  The patient is a 31 year old female who presents for evaluation of gall stones. She is a 31 year old white female who has had attacks of pain at night that awaken her. She will have pain in the midepigastrium and right upper quadrant. No jaundice. [facility] and this showed many gallstones. She has GER and morbid obestiy (BMI 44). I discussed lap chole with her in detail including complications not limited to bile leaks or common bile duct injuries. Both of her parents have had gallbladder issues.   I discussed a low carb approach to weight loss and explained low glycemic index foods in detail.   [facility]   Allergies Doristine Devoid(Chemira Jones, CMA; 06/02/2016 3:33 PM) No Known Drug Allergies 06/02/2016  Medication History Doristine Devoid(Chemira Jones, CMA; 06/02/2016 3:34 PM) Loratadine (10MG  Capsule, Oral) Active. Pantoprazole Sodium (40MG  Tablet DR, Oral) Active. Medications Reconciled  Vitals (Chemira Jones CMA; 06/02/2016 3:33 PM) 06/02/2016 3:33 PM Weight: 306.4 lb Height: 69in Body Surface Area: 2.48 m Body Mass Index: 45.25 kg/m  Temp.: 98.71F(Oral)  BP: 120/80 (Sitting, Left Arm, Standard)       Physical Exam (Corderro Koloski B. Daphine DeutscherMartin MD; 06/02/2016 3:59 PM) General Note: obese WF NAD HEENT without icterus Neck supple without adenopathy Chest clear Heart SR without murmurs Abdomen nontender at present Ext FROM     Assessment & Plan Molli Hazard(Velita Quirk B. Daphine DeutscherMartin MD; 06/02/2016 4:00 PM) CHRONIC CHOLECYSTITIS WITH CALCULUS (K80.10) Impression: chronic cholecystits with multiple gallstones [facility]. [facility]  Matt B. Daphine DeutscherMartin, MD, FACS

## 2016-07-05 NOTE — Discharge Instructions (Signed)
Laparoscopic Cholecystectomy, Care After This sheet gives you information about how to care for yourself after your procedure. Your doctor may also give you more specific instructions. If you have problems or questions, contact your doctor. Follow these instructions at home: Care for cuts from surgery (incisions)   Follow instructions from your doctor about how to take care of your cuts from surgery. Make sure you: ? Wash your hands with soap and water before you change your bandage (dressing). If you cannot use soap and water, use hand sanitizer. ? Change your bandage as told by your doctor. ? Leave stitches (sutures), skin glue, or skin tape (adhesive) strips in place. They may need to stay in place for 2 weeks or longer. If tape strips get loose and curl up, you may trim the loose edges. Do not remove tape strips completely unless your doctor says it is okay.  Do not take baths, swim, or use a hot tub until your doctor says it is okay. Ask your doctor if you can take showers. You may only be allowed to take sponge baths for bathing.  Check your surgical cut area every day for signs of infection. Check for: ? More redness, swelling, or pain. ? More fluid or blood. ? Warmth. ? Pus or a bad smell. Activity  Do not drive or use heavy machinery while taking prescription pain medicine.  Do not lift anything that is heavier than 10 lb (4.5 kg) until your doctor says it is okay.  Do not play contact sports until your doctor says it is okay.  Do not drive for 24 hours if you were given a medicine to help you relax (sedative).  Rest as needed. Do not return to work or school until your doctor says it is okay. General instructions  Take over-the-counter and prescription medicines only as told by your doctor.  To prevent or treat constipation while you are taking prescription pain medicine, your doctor may recommend that you: ? Drink enough fluid to keep your pee (urine)  clear or pale yellow. ? Take over-the-counter or prescription medicines. ? Eat foods that are high in fiber, such as fresh fruits and vegetables, whole grains, and beans. ? Limit foods that are high in fat and processed sugars, such as fried and sweet foods. Contact a doctor if:  You develop a rash.  You have more redness, swelling, or pain around your surgical cuts.  You have more fluid or blood coming from your surgical cuts.  Your surgical cuts feel warm to the touch.  You have pus or a bad smell coming from your surgical cuts.  You have a fever.  One or more of your surgical cuts breaks open. Get help right away if:  You have trouble breathing.  You have chest pain.  You have pain that is getting worse in your shoulders.  You faint or feel dizzy when you stand.  You have very bad pain in your belly (abdomen).  You are sick to your stomach (nauseous) for more than one day.  You have throwing up (vomiting) that lasts for more than one day.  You have leg pain. This information is not intended to replace advice given to you by your health care provider. Make sure you discuss any questions you have with your health care provider. Document Released: 10/13/2007 Document Revised: 07/25/2015 Document Reviewed: 06/22/2015 Elsevier Interactive Patient Education  2017 [employer]      Laparoscopic Cholecystectomy  Laparoscopic cholecystectomy is surgery to remove the gallbladder. The gallbladder is a pear-shaped organ that lies beneath the liver on the right side of the body. The gallbladder stores bile, which is a fluid that helps the body to digest fats. Cholecystectomy is often done for inflammation of the gallbladder (cholecystitis). This condition is usually caused by a buildup of gallstones (cholelithiasis) in the gallbladder. Gallstones can block the flow of bile, which can result in inflammation and pain. In severe cases, emergency surgery may be required. This  procedure is done though small incisions in your abdomen (laparoscopic surgery). A thin scope with a camera (laparoscope) is inserted through one incision. Thin surgical instruments are inserted through the other incisions. In some cases, a laparoscopic procedure may be turned into a type of surgery that is done through a larger incision (open surgery). Tell a health care provider about:  Any allergies you have.  All medicines you are taking, including vitamins, herbs, eye drops, creams, and over-the-counter medicines.  Any problems you or family members have had with anesthetic medicines.  Any blood disorders you have.  Any surgeries you have had.  Any medical conditions you have.  Whether you are pregnant or may be pregnant. What are the risks? Generally, this is a safe procedure. However, problems may occur, including:  Infection.  Bleeding.  Allergic reactions to medicines.  Damage to other structures or organs.  A stone remaining in the common bile duct. The common bile duct carries bile from the gallbladder into the small intestine.  A bile leak from the cyst duct that is clipped when your gallbladder is removed.  What happens before the procedure? Staying hydrated Follow instructions from your health care provider about hydration, which may include:  Up to 2 hours before the procedure - you may continue to drink clear liquids, such as water, clear fruit juice, black coffee, and plain tea.  Eating and drinking restrictions Follow instructions from your health care provider about eating and drinking, which may include:  8 hours before the procedure - stop eating heavy meals or foods such as meat, fried foods, or fatty foods.  6 hours before the procedure - stop eating light meals or foods, such as toast or cereal.  6 hours before the procedure - stop drinking milk or drinks that contain milk.  2 hours before the procedure - stop drinking clear  liquids.  Medicines  Ask your health care provider about: ? Changing or stopping your regular medicines. This is especially important if you are taking diabetes medicines or blood thinners. ? Taking medicines such as aspirin and ibuprofen. These medicines can thin your blood. Do not take these medicines before your procedure if your health care provider instructs you not to.  You may be given antibiotic medicine to help prevent infection. General instructions  Let your health care provider know if you develop a cold or an infection before surgery.  Plan to have someone take you home from the hospital or clinic.  Ask your health care provider how your surgical site will be marked or identified. What happens during the procedure?  To reduce your risk of infection: ? Your health care team will wash or sanitize their hands. ? Your skin will be washed with soap. ? Hair may be removed from the surgical area.  An IV tube may be inserted into one of your veins.  You will be given one or more of the following: ? A medicine to help you relax (sedative). ?  A medicine to make you fall asleep (general anesthetic).  A breathing tube will be placed in your mouth.  Your surgeon will make several small cuts (incisions) in your abdomen.  The laparoscope will be inserted through one of the small incisions. The camera on the laparoscope will send images to a TV screen (monitor) in the operating room. This lets your surgeon see inside your abdomen.  Air-like gas will be pumped into your abdomen. This will expand your abdomen to give the surgeon more room to perform the surgery.  Other tools that are needed for the procedure will be inserted through the other incisions. The gallbladder will be removed through one of the incisions.  Your common bile duct may be examined. If stones are found in the common bile duct, they may be removed.  After your gallbladder has been removed, the incisions will be  closed with stitches (sutures), staples, or skin glue.  Your incisions may be covered with a bandage (dressing). The procedure may vary among health care providers and hospitals. What happens after the procedure?  Your blood pressure, heart rate, breathing rate, and blood oxygen level will be monitored until the medicines you were given have worn off.  You will be given medicines as needed to control your pain.  Do not drive for 24 hours if you were given a sedative. This information is not intended to replace advice given to you by your health care provider. Make sure you discuss any questions you have with your health care provider. Document Released: 01/03/2005 Document Revised: 07/26/2015 Document Reviewed: 06/22/2015 Elsevier Interactive Patient Education  2018 [employer].

## 2016-07-05 NOTE — Anesthesia Procedure Notes (Signed)
Procedure Name: Intubation Date/Time: 07/05/2016 7:40 AM Performed by: Maxwell Caul Pre-anesthesia Checklist: Patient identified, Emergency Drugs available, Suction available and Patient being monitored Patient Re-evaluated:Patient Re-evaluated prior to inductionOxygen Delivery Method: Circle system utilized Preoxygenation: Pre-oxygenation with 100% oxygen Intubation Type: IV induction Ventilation: Mask ventilation without difficulty and Oral airway inserted - appropriate to patient size Laryngoscope Size: Mac and 4 Grade View: Grade I Tube type: Oral Tube size: 7.5 mm Number of attempts: 1 Airway Equipment and Method: Stylet and Oral airway Placement Confirmation: ETT inserted through vocal cords under direct vision,  positive ETCO2 and breath sounds checked- equal and bilateral Secured at: 21 cm Tube secured with: Tape Dental Injury: Teeth and Oropharynx as per pre-operative assessment

## 2016-07-05 NOTE — Progress Notes (Signed)
  July 05, 2016  Patient: Amber [name]  Date of Birth: May 08, 1985  Date of Visit: 06/24/2016    To Whom It May Concern:  Amber [name] was seen and treated in our hospital on 06/24/2016.     Sincerely,  Jamelle RushingSusan Kalsey Lull RN

## 2016-07-06 ENCOUNTER — Encounter (HOSPITAL_COMMUNITY): Payer: Self-pay | Admitting: Surgery

## 2016-07-27 ENCOUNTER — Other Ambulatory Visit: Payer: Self-pay | Admitting: Gastroenterology

## 2016-08-10 ENCOUNTER — Other Ambulatory Visit: Payer: Self-pay | Admitting: Family Medicine

## 2016-08-10 NOTE — Telephone Encounter (Deleted)
Last office visit?

## 2016-10-28 ENCOUNTER — Encounter: Payer: Self-pay | Admitting: Obstetrics and Gynecology

## 2017-01-11 DIAGNOSIS — J988 Other specified respiratory disorders: Secondary | ICD-10-CM | POA: Diagnosis not present

## 2017-01-11 DIAGNOSIS — J040 Acute laryngitis: Secondary | ICD-10-CM | POA: Diagnosis not present

## 2017-01-11 DIAGNOSIS — H66001 Acute suppurative otitis media without spontaneous rupture of ear drum, right ear: Secondary | ICD-10-CM | POA: Diagnosis not present

## 2017-01-18 ENCOUNTER — Encounter: Payer: Self-pay | Admitting: Obstetrics and Gynecology

## 2017-01-18 ENCOUNTER — Other Ambulatory Visit: Payer: Self-pay | Admitting: Obstetrics and Gynecology

## 2017-01-18 ENCOUNTER — Ambulatory Visit (INDEPENDENT_AMBULATORY_CARE_PROVIDER_SITE_OTHER): Payer: BLUE CROSS/BLUE SHIELD | Admitting: Obstetrics and Gynecology

## 2017-01-18 VITALS — BP 122/84 | HR 105 | Ht 69.0 in | Wt 314.0 lb

## 2017-01-18 DIAGNOSIS — Z30431 Encounter for routine checking of intrauterine contraceptive device: Secondary | ICD-10-CM | POA: Diagnosis not present

## 2017-01-18 DIAGNOSIS — Z01419 Encounter for gynecological examination (general) (routine) without abnormal findings: Secondary | ICD-10-CM | POA: Diagnosis not present

## 2017-01-18 DIAGNOSIS — E669 Obesity, unspecified: Secondary | ICD-10-CM | POA: Diagnosis not present

## 2017-01-18 NOTE — Patient Instructions (Signed)
Place annual gynecologic exam patient instructions here.

## 2017-01-18 NOTE — Progress Notes (Signed)
Subjective:   Amber [name] is a 32 y.o. G1P1 Caucasian female here for a routine well-woman exam.  No LMP recorded. Patient is not currently having periods (Reason: IUD).    Current complaints: none PCP: [facility]       does desire labs  Social History: Sexual: heterosexual Marital Status: married Living situation: with family Occupation: Associate Professor at [employer] Tobacco/alcohol: no tobacco use Illicit drugs: no history of illicit drug use  The following portions of the patient's history were reviewed and updated as appropriate: allergies, current medications, past family history, past medical history, past social history, past surgical history and problem list.  Past Medical History Past Medical History:  Diagnosis Date  . Anemia    iron deficiency  . Arthritis   . Asthma    as a child   . Family history of adverse reaction to anesthesia    mother gets n/v with anesthesia   . GERD (gastroesophageal reflux disease)   . Headache(784.0)   . Heart murmur    as a baby   . History of kidney stones   . Morbid obesity (HCC)   . Palpitations   . Stress fracture    Medial tibial plateau; mild medial retinacular sprain and PF lig sprain     Past Surgical History Past Surgical History:  Procedure Laterality Date  . LAPAROSCOPIC CHOLECYSTECTOMY SINGLE SITE WITH INTRAOPERATIVE CHOLANGIOGRAM N/A 07/05/2016   Procedure: LAPAROSCOPIC CHOLECYSTECTOMY  WITH INTRAOPERATIVE CHOLANGIOGRAM;  Surgeon: [name], MD;  Location: [facility];  Service: General;  Laterality: N/A;  . tubes in ears     multiple     Gynecologic History G1P1  No LMP recorded. Patient is not currently having periods (Reason: IUD). Contraception: IUD Last Pap: 2015. Results were: normal per patient  Obstetric History OB History  Gravida Para Term Preterm AB Living  1 1          SAB TAB Ectopic Multiple Live Births               # Outcome Date GA Lbr Len/2nd Weight Sex Delivery Anes PTL Lv  1 Para 2015      Vag-Spont         Current Medications Current Outpatient Medications on File Prior to Visit  Medication Sig Dispense Refill  . cetirizine (ZYRTEC) 10 MG tablet Take 10 mg by mouth daily.    . cyclobenzaprine (FLEXERIL) 5 MG tablet TAKE 1-2 TABLETS BY MOUTH AT BEDTIME AS NEEDED 50 tablet 1  . levonorgestrel (MIRENA) 20 MCG/24HR IUD 1 each by Intrauterine route once.    . pantoprazole (PROTONIX) 40 MG tablet TAKE ONE TABLET BY MOUTH TWICE DAILY BEFORE A MEAL 60 tablet 11  . SUMAtriptan (IMITREX) 50 MG tablet Take 1 tablet (50 mg total) by mouth every 2 (two) hours as needed for migraine. May repeat in 2 hours if headache persists or recurs. 10 tablet 0   No current facility-administered medications on file prior to visit.     Review of Systems Patient denies any headaches, blurred vision, shortness of breath, chest pain, abdominal pain, problems with bowel movements, urination, or intercourse.  Objective:  BP 122/84   Pulse (!) 105   Ht 5\' 9"  (1.753 m)   Wt (!) 314 lb (142.4 kg)   BMI 46.37 kg/m  Physical Exam  General:  Well developed, well nourished, no acute distress. She is alert and oriented x3. Skin:  Warm and dry Neck:  Midline trachea, no thyromegaly or nodules Cardiovascular:  Regular rate and rhythm, no murmur heard Lungs:  Effort normal, all lung fields clear to auscultation bilaterally Breasts:  No dominant palpable mass, retraction, or nipple discharge Abdomen:  Soft, non tender, no hepatosplenomegaly or masses Pelvic:  External genitalia is normal in appearance.  The vagina is normal in appearance. The cervix is bulbous, no CMT. IUD string noted.  Thin prep pap is done with HR HPV cotesting. Uterus is felt to be normal size, shape, and contour.  No adnexal masses or tenderness noted. Extremities:  No swelling or varicosities noted Psych:  She has a normal mood and affect  Assessment:   Healthy well-woman exam IUD check Obesity   Plan:  Labs obtained- will  follow up accordingly F/U 1 year for AE, or sooner if needed   Lagretta Loseke Suzan NailerN Amber [name], CNM

## 2017-01-19 LAB — LIPID PANEL
CHOL/HDL RATIO: 4.2 ratio (ref 0.0–4.4)
Cholesterol, Total: 206 mg/dL — ABNORMAL HIGH (ref 100–199)
HDL: 49 mg/dL (ref >39)
LDL CALC: 132 mg/dL — AB (ref 0–99)
Triglycerides: 126 mg/dL (ref 0–149)
VLDL Cholesterol Cal: 25 mg/dL (ref 5–40)

## 2017-01-19 LAB — COMPREHENSIVE METABOLIC PANEL
ALK PHOS: 61 IU/L (ref 39–117)
ALT: 26 IU/L (ref 0–32)
AST: 14 IU/L (ref 0–40)
Albumin/Globulin Ratio: 1.7 (ref 1.2–2.2)
Albumin: 4.5 g/dL (ref 3.5–5.5)
BUN/Creatinine Ratio: 13 (ref 9–23)
BUN: 11 mg/dL (ref 6–20)
Bilirubin Total: 0.4 mg/dL (ref 0.0–1.2)
CALCIUM: 9.7 mg/dL (ref 8.7–10.2)
CO2: 22 mmol/L (ref 20–29)
CREATININE: 0.83 mg/dL (ref 0.57–1.00)
Chloride: 100 mmol/L (ref 96–106)
GFR calc Af Amer: 108 mL/min/{1.73_m2} (ref >59)
GFR, EST NON AFRICAN AMERICAN: 94 mL/min/{1.73_m2} (ref >59)
Globulin, Total: 2.6 g/dL (ref 1.5–4.5)
Glucose: 101 mg/dL — ABNORMAL HIGH (ref 65–99)
Potassium: 4.5 mmol/L (ref 3.5–5.2)
Sodium: 140 mmol/L (ref 134–144)
Total Protein: 7.1 g/dL (ref 6.0–8.5)

## 2017-01-19 LAB — HEMOGLOBIN A1C
ESTIMATED AVERAGE GLUCOSE: 111 mg/dL
HEMOGLOBIN A1C: 5.5 % (ref 4.8–5.6)

## 2017-01-20 LAB — CYTOLOGY - PAP

## 2017-01-26 ENCOUNTER — Other Ambulatory Visit: Payer: Self-pay | Admitting: Family Medicine

## 2017-01-26 NOTE — Telephone Encounter (Signed)
Last office visit 02/19/2015 with Dr. Ermalene SearingBedsole.  Last seen by Dr. Patsy Lageropland 01/14/2015. Last refilled 03/01/2016 for #50 with 1 refill. Refill?

## 2017-04-25 ENCOUNTER — Encounter: Payer: Self-pay | Admitting: Obstetrics and Gynecology

## 2017-04-26 ENCOUNTER — Other Ambulatory Visit: Payer: Self-pay | Admitting: *Deleted

## 2017-04-26 MED ORDER — MONTELUKAST SODIUM 10 MG PO TABS: 10.0000 mg | ORAL_TABLET | Freq: Every day | ORAL | 3 refills | Status: DC

## 2017-07-19 ENCOUNTER — Other Ambulatory Visit: Payer: Self-pay | Admitting: *Deleted

## 2017-07-19 MED ORDER — MONTELUKAST SODIUM 10 MG PO TABS: 10.0000 mg | ORAL_TABLET | Freq: Every day | ORAL | 3 refills | Status: DC

## 2017-07-30 IMAGING — US US ABDOMEN COMPLETE
1 series · 13 of 25 positions shown · non-contrast
Comparison: None.

CLINICAL DATA: Epigastric pain, atypical chest pain

EXAM:
ABDOMEN ULTRASOUND COMPLETE

[Series 1: us abdomen complete · 0.23mm/px · 13 of 81 slices shown]
[im 1/81]
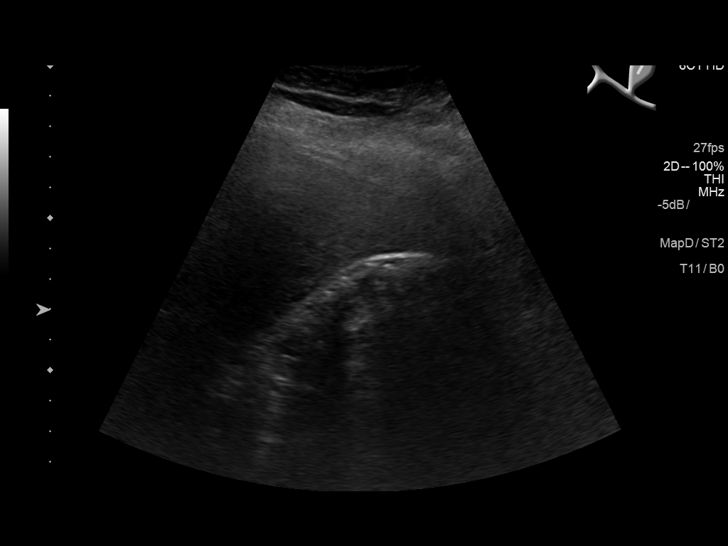
[im 7/81]
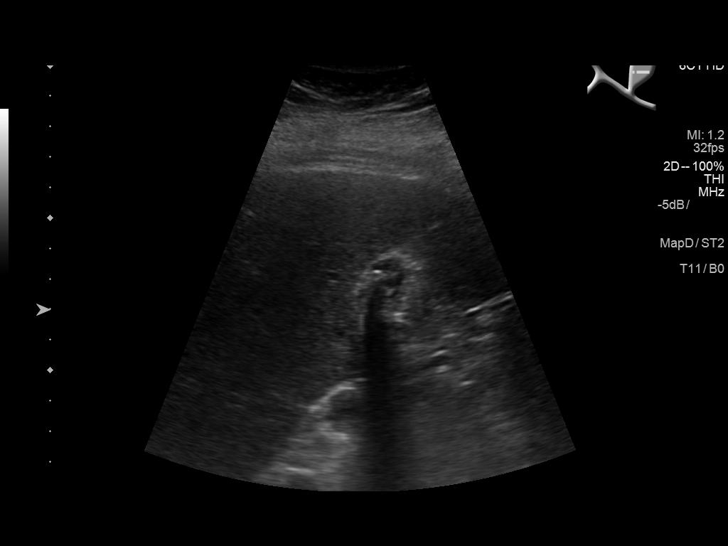
[im 14/81]
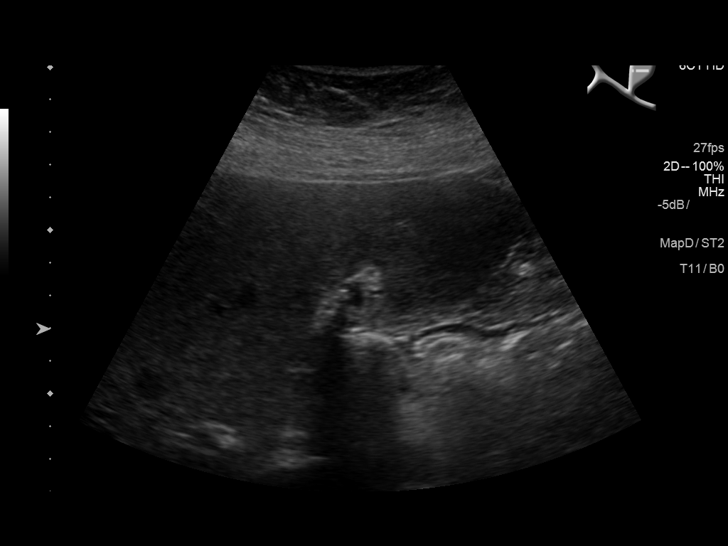
[im 21/81]
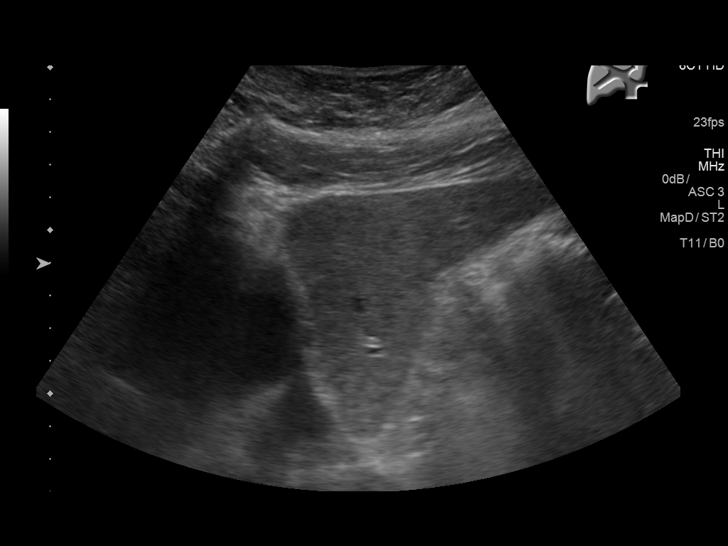
[im 27/81]
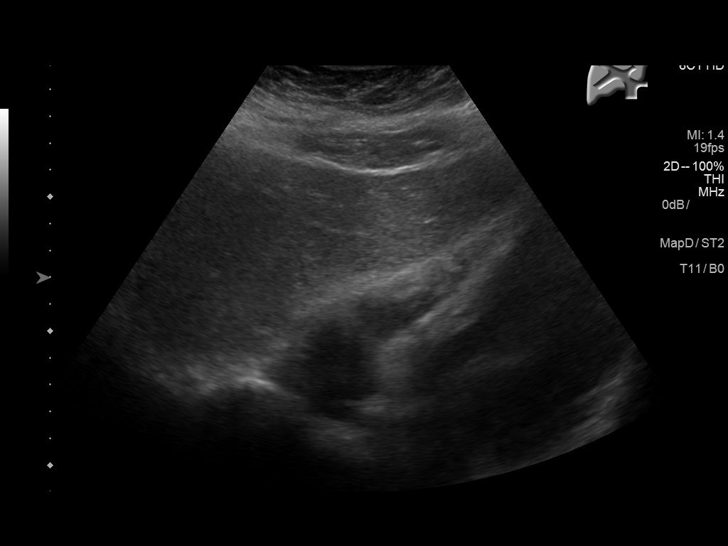
[im 34/81]
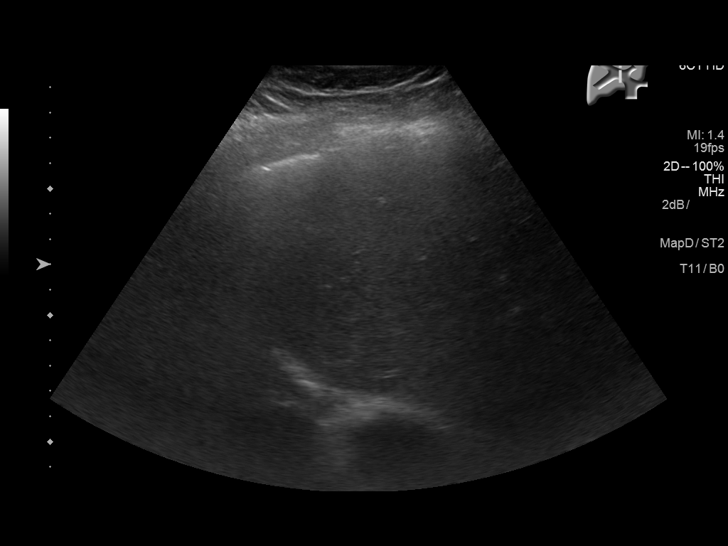
[im 41/81]
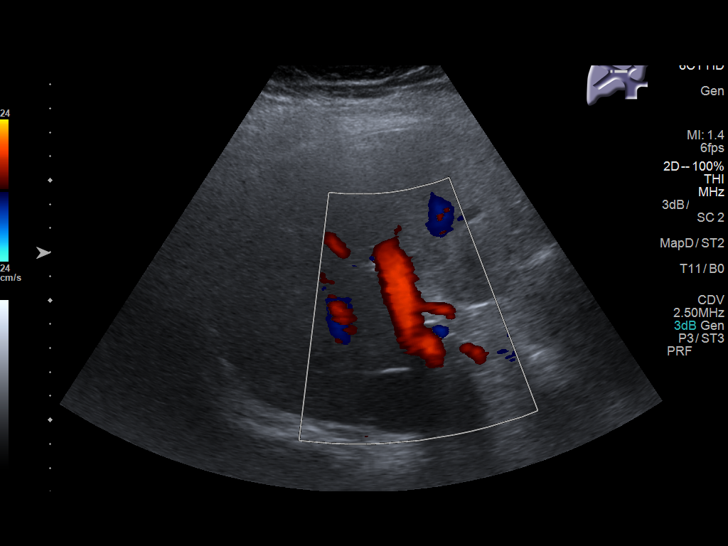
[im 47/81]
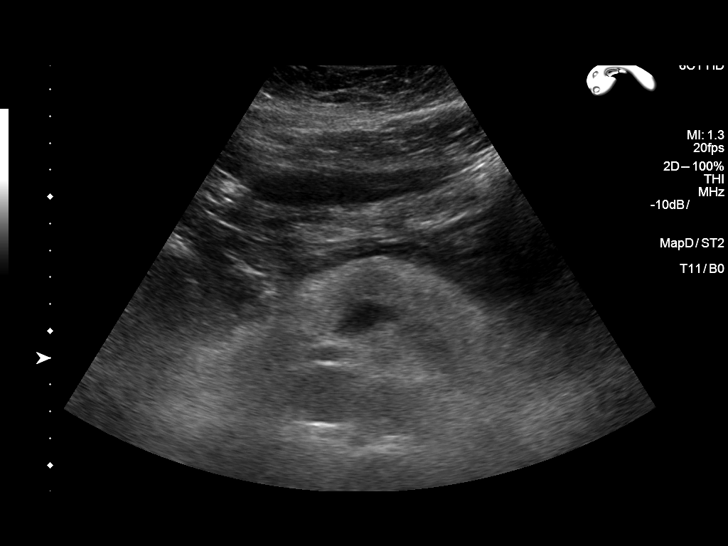
[im 54/81]
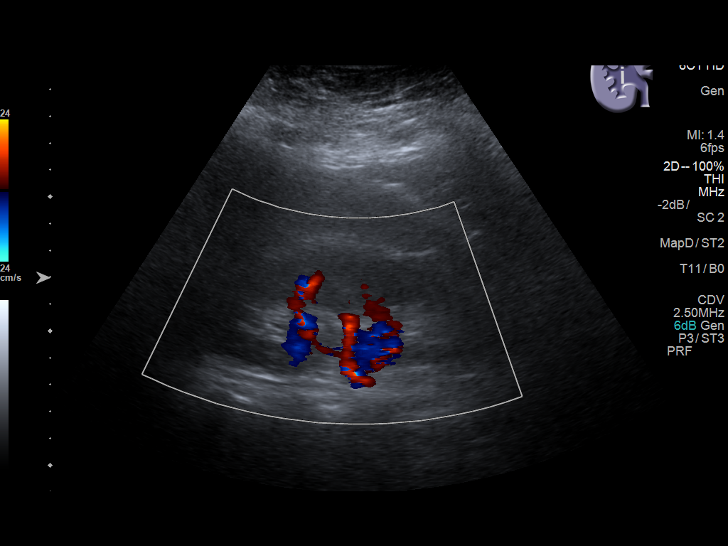
[im 61/81]
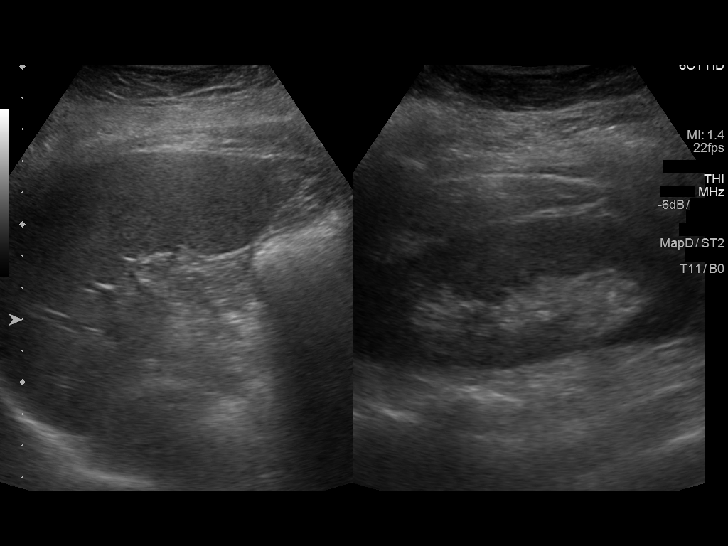
[im 67/81]
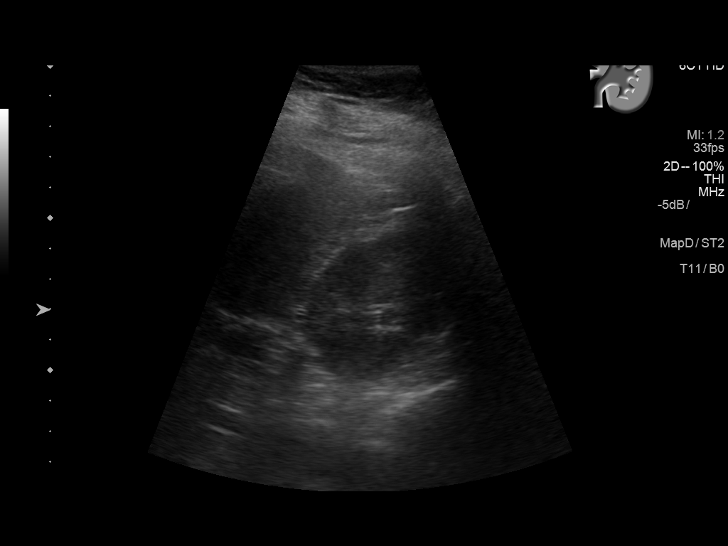
[im 74/81]
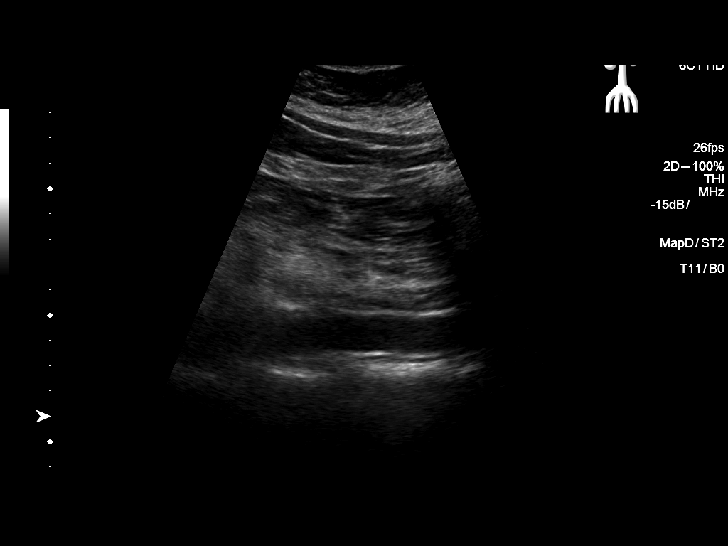
[im 81/81]
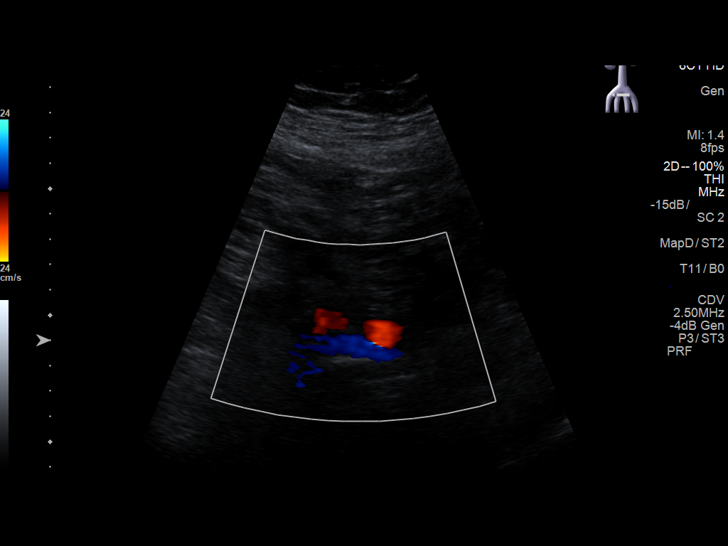

[13 of 25 positions shown; findings below may reference images not displayed]

FINDINGS: Gallbladder: Gallbladder is filled with cholelithiasis. Gallbladder
wall thickness anteriorly measures 2 mm. No pericholecystic fluid.
No sonographic Murphy sign.

Common bile duct: Diameter: 3 mm

Liver: No focal lesion identified. Within normal limits in
parenchymal echogenicity.

IVC: No abnormality visualized.

Pancreas: Limited visualization secondary to overlying bowel gas.
Visualized portions demonstrate no focal abnormality.

Spleen: Size and appearance within normal limits.

Right Kidney: Length: 10.6 cm. Echogenicity within normal limits. No
mass or hydronephrosis visualized.

Left Kidney: Length: 11.7 cm. Echogenicity within normal limits. No
mass or hydronephrosis visualized.

Abdominal aorta: Abdominal aorta measures 2.5 cm in diameter.

Other findings: None.
IMPRESSION: 1. Gallbladder is filled with cholelithiasis without sonographic
evidence of acute cholecystitis.
2. Ectatic abdominal aorta at risk for aneurysm development.
Recommend followup by ultrasound in 5 years. This recommendation
follows ACR consensus guidelines: White Paper of the ACR [employer]

## 2017-08-01 ENCOUNTER — Other Ambulatory Visit: Payer: Self-pay | Admitting: Gastroenterology

## 2017-08-03 DIAGNOSIS — M722 Plantar fascial fibromatosis: Secondary | ICD-10-CM | POA: Diagnosis not present

## 2017-09-08 DIAGNOSIS — R109 Unspecified abdominal pain: Secondary | ICD-10-CM | POA: Diagnosis not present

## 2017-09-08 DIAGNOSIS — M5416 Radiculopathy, lumbar region: Secondary | ICD-10-CM | POA: Diagnosis not present

## 2017-11-06 ENCOUNTER — Other Ambulatory Visit: Payer: Self-pay | Admitting: Obstetrics and Gynecology

## 2017-12-20 DIAGNOSIS — J111 Influenza due to unidentified influenza virus with other respiratory manifestations: Secondary | ICD-10-CM | POA: Diagnosis not present

## 2017-12-20 DIAGNOSIS — R6889 Other general symptoms and signs: Secondary | ICD-10-CM | POA: Diagnosis not present

## 2018-01-19 ENCOUNTER — Encounter: Payer: BLUE CROSS/BLUE SHIELD | Admitting: Obstetrics and Gynecology

## 2018-02-01 ENCOUNTER — Encounter: Payer: BLUE CROSS/BLUE SHIELD | Admitting: Obstetrics and Gynecology

## 2018-02-02 ENCOUNTER — Other Ambulatory Visit: Payer: Self-pay | Admitting: *Deleted

## 2018-02-02 ENCOUNTER — Encounter: Payer: Self-pay | Admitting: Obstetrics and Gynecology

## 2018-02-02 ENCOUNTER — Ambulatory Visit (INDEPENDENT_AMBULATORY_CARE_PROVIDER_SITE_OTHER): Payer: BLUE CROSS/BLUE SHIELD | Admitting: Obstetrics and Gynecology

## 2018-02-02 VITALS — BP 107/75 | HR 88 | Ht 69.0 in | Wt 311.8 lb

## 2018-02-02 DIAGNOSIS — Z01419 Encounter for gynecological examination (general) (routine) without abnormal findings: Secondary | ICD-10-CM

## 2018-02-02 DIAGNOSIS — Z30431 Encounter for routine checking of intrauterine contraceptive device: Secondary | ICD-10-CM

## 2018-02-02 DIAGNOSIS — E7800 Pure hypercholesterolemia, unspecified: Secondary | ICD-10-CM | POA: Diagnosis not present

## 2018-02-02 DIAGNOSIS — Z6841 Body Mass Index (BMI) 40.0 and over, adult: Secondary | ICD-10-CM | POA: Diagnosis not present

## 2018-02-02 MED ORDER — PANTOPRAZOLE SODIUM 40 MG PO TBEC: DELAYED_RELEASE_TABLET | ORAL | 11 refills | Status: DC

## 2018-02-02 NOTE — Patient Instructions (Signed)
 Preventive Care 18-39 Years, Female Preventive care refers to lifestyle choices and visits with your health care provider that can promote health and wellness. What does preventive care include?   A yearly physical exam. This is also called an annual well check.  Dental exams once or twice a year.  Routine eye exams. Ask your health care provider how often you should have your eyes checked.  Personal lifestyle choices, including: ? Daily care of your teeth and gums. ? Regular physical activity. ? Eating a healthy diet. ? Avoiding tobacco and drug use. ? Limiting alcohol use. ? Practicing safe sex. ? Taking vitamin and mineral supplements as recommended by your health care provider. What happens during an annual well check? The services and screenings done by your health care provider during your annual well check will depend on your age, overall health, lifestyle risk factors, and family history of disease. Counseling Your health care provider may ask you questions about your:  Alcohol use.  Tobacco use.  Drug use.  Emotional well-being.  Home and relationship well-being.  Sexual activity.  Eating habits.  Work and work environment.  Method of birth control.  Menstrual cycle.  Pregnancy history. Screening You may have the following tests or measurements:  Height, weight, and BMI.  Diabetes screening. This is done by checking your blood sugar (glucose) after you have not eaten for a while (fasting).  Blood pressure.  Lipid and cholesterol levels. These may be checked every 5 years starting at age 20.  Skin check.  Hepatitis C blood test.  Hepatitis B blood test.  Sexually transmitted disease (STD) testing.  BRCA-related cancer screening. This may be done if you have a family history of breast, ovarian, tubal, or peritoneal cancers.  Pelvic exam and Pap test. This may be done every 3 years starting at age 21. Starting at age 30, this may be done  every 5 years if you have a Pap test in combination with an HPV test. Discuss your test results, treatment options, and if necessary, the need for more tests with your health care provider. Vaccines Your health care provider may recommend certain vaccines, such as:  Influenza vaccine. This is recommended every year.  Tetanus, diphtheria, and acellular pertussis (Tdap, Td) vaccine. You may need a Td booster every 10 years.  Varicella vaccine. You may need this if you have not been vaccinated.  HPV vaccine. If you are 26 or younger, you may need three doses over 6 months.  Measles, mumps, and rubella (MMR) vaccine. You may need at least one dose of MMR. You may also need a second dose.  Pneumococcal 13-valent conjugate (PCV13) vaccine. You may need this if you have certain conditions and were not previously vaccinated.  Pneumococcal polysaccharide (PPSV23) vaccine. You may need one or two doses if you smoke cigarettes or if you have certain conditions.  Meningococcal vaccine. One dose is recommended if you are age 19-21 years and a first-year college student living in a residence hall, or if you have one of several medical conditions. You may also need additional booster doses.  Hepatitis A vaccine. You may need this if you have certain conditions or if you travel or work in places where you may be exposed to hepatitis A.  Hepatitis B vaccine. You may need this if you have certain conditions or if you travel or work in places where you may be exposed to hepatitis B.  Haemophilus influenzae type b (Hib) vaccine. You may need this if   you have certain risk factors. Talk to your health care provider about which screenings and vaccines you need and how often you need them. This information is not intended to replace advice given to you by your health care provider. Make sure you discuss any questions you have with your health care provider. Document Released: 03/01/2001 Document Revised:  08/16/2016 Document Reviewed: 11/04/2014 Elsevier Interactive Patient Education  2019 [employer]vier Inc.  

## 2018-02-02 NOTE — Progress Notes (Signed)
Subjective:   Amber [name] is a 33 y.o. G1P1 Caucasian female here for a routine well-woman exam.  No LMP recorded. (Menstrual status: IUD).    Current complaints: none PCP: [facility]       Does need labs  Social History: Sexual: heterosexual Marital Status: married Living situation: with family Occupation: Associate Professor at [employer] Tobacco/alcohol: no tobacco use Illicit drugs: no history of illicit drug use  The following portions of the patient's history were reviewed and updated as appropriate: allergies, current medications, past family history, past medical history, past social history, past surgical history and problem list.  Past Medical History Past Medical History:  Diagnosis Date  . Anemia    iron deficiency  . Arthritis   . Asthma    as a child   . Family history of adverse reaction to anesthesia    mother gets n/v with anesthesia   . GERD (gastroesophageal reflux disease)   . Headache(784.0)   . Heart murmur    as a baby   . History of kidney stones   . Morbid obesity (HCC)   . Palpitations   . Stress fracture    Medial tibial plateau; mild medial retinacular sprain and PF lig sprain     Past Surgical History Past Surgical History:  Procedure Laterality Date  . LAPAROSCOPIC CHOLECYSTECTOMY SINGLE SITE WITH INTRAOPERATIVE CHOLANGIOGRAM N/A 07/05/2016   Procedure: LAPAROSCOPIC CHOLECYSTECTOMY  WITH INTRAOPERATIVE CHOLANGIOGRAM;  Surgeon: [name], MD;  Location: [facility];  Service: General;  Laterality: N/A;  . tubes in ears     multiple     Gynecologic History G1P1  No LMP recorded. (Menstrual status: IUD). Contraception: IUD Last Pap: 2019. Results were: normal   Obstetric History OB History  Gravida Para Term Preterm AB Living  1 1          SAB TAB Ectopic Multiple Live Births               # Outcome Date GA Lbr Len/2nd Weight Sex Delivery Anes PTL Lv  1 Para 2015     Vag-Spont       Current Medications Current Outpatient  Medications on File Prior to Visit  Medication Sig Dispense Refill  . cetirizine (ZYRTEC) 10 MG tablet Take 10 mg by mouth daily.    Marland Kitchen levonorgestrel (MIRENA) 20 MCG/24HR IUD 1 each by Intrauterine route once.    . montelukast (SINGULAIR) 10 MG tablet TAKE 1 TABLET AT BEDTIME 30 tablet 3  . cyclobenzaprine (FLEXERIL) 5 MG tablet TAKE 1-2 TABLETS BY MOUTH AT BEDTIME AS NEEDED (Patient not taking: Reported on 02/02/2018) 50 tablet 1  . pantoprazole (PROTONIX) 40 MG tablet TAKE ONE TABLET BY MOUTH TWICE DAILY BEFORE A MEAL (Patient not taking: Reported on 02/02/2018) 60 tablet 11  . SUMAtriptan (IMITREX) 50 MG tablet Take 1 tablet (50 mg total) by mouth every 2 (two) hours as needed for migraine. May repeat in 2 hours if headache persists or recurs. (Patient not taking: Reported on 02/02/2018) 10 tablet 0   No current facility-administered medications on file prior to visit.     Review of Systems Patient denies any headaches, blurred vision, shortness of breath, chest pain, abdominal pain, problems with bowel movements, urination, or intercourse.  Objective:  BP 107/75   Pulse 88   Ht 5\' 9"  (1.753 m)   Wt (!) 311 lb 12.8 oz (141.4 kg)   BMI 46.04 kg/m  Physical Exam  General:  Well developed, well nourished, no acute  distress. She is alert and oriented x3. Skin:  Warm and dry Neck:  Midline trachea, no thyromegaly or nodules Cardiovascular: Regular rate and rhythm, no murmur heard Lungs:  Effort normal, all lung fields clear to auscultation bilaterally Breasts:  No dominant palpable mass, retraction, or nipple discharge Abdomen:  Soft, non tender, no hepatosplenomegaly or masses Pelvic:  External genitalia is normal in appearance.  The vagina is normal in appearance. The cervix is bulbous, no CMT.  Thin prep pap is not done.IUD string noted. Uterus is felt to be normal size, shape, and contour.  No adnexal masses or tenderness noted. Extremities:  No swelling or varicosities noted Psych:   She has a normal mood and affect  Assessment:   Healthy well-woman exam IUD check Elevated cholesterol Obesity/BMI 46  Plan:  Labs obtained-will follow up accordingly F/U 1 year for AE, or sooner if needed   Carl Butner Suzan Nailer, CNM

## 2018-02-03 LAB — THYROID PANEL WITH TSH
FREE THYROXINE INDEX: 1.7 (ref 1.2–4.9)
T3 Uptake Ratio: 22 % — ABNORMAL LOW (ref 24–39)
T4, Total: 7.5 ug/dL (ref 4.5–12.0)
TSH: 7.14 u[IU]/mL — ABNORMAL HIGH (ref 0.450–4.500)

## 2018-02-03 LAB — COMPREHENSIVE METABOLIC PANEL
ALBUMIN: 4.2 g/dL (ref 3.5–5.5)
ALT: 16 IU/L (ref 0–32)
AST: 11 IU/L (ref 0–40)
Albumin/Globulin Ratio: 1.7 (ref 1.2–2.2)
Alkaline Phosphatase: 58 IU/L (ref 39–117)
BILIRUBIN TOTAL: 0.3 mg/dL (ref 0.0–1.2)
BUN / CREAT RATIO: 15 (ref 9–23)
BUN: 12 mg/dL (ref 6–20)
CALCIUM: 9.3 mg/dL (ref 8.7–10.2)
CO2: 23 mmol/L (ref 20–29)
CREATININE: 0.81 mg/dL (ref 0.57–1.00)
Chloride: 101 mmol/L (ref 96–106)
GFR calc non Af Amer: 96 mL/min/{1.73_m2} (ref >59)
GFR, EST AFRICAN AMERICAN: 110 mL/min/{1.73_m2} (ref >59)
Globulin, Total: 2.5 g/dL (ref 1.5–4.5)
Glucose: 88 mg/dL (ref 65–99)
Potassium: 4.3 mmol/L (ref 3.5–5.2)
Sodium: 136 mmol/L (ref 134–144)
TOTAL PROTEIN: 6.7 g/dL (ref 6.0–8.5)

## 2018-02-03 LAB — LIPID PANEL
Chol/HDL Ratio: 3.4 ratio (ref 0.0–4.4)
Cholesterol, Total: 174 mg/dL (ref 100–199)
HDL: 51 mg/dL (ref >39)
LDL CALC: 115 mg/dL — AB (ref 0–99)
TRIGLYCERIDES: 41 mg/dL (ref 0–149)
VLDL CHOLESTEROL CAL: 8 mg/dL (ref 5–40)

## 2018-02-03 LAB — HEMOGLOBIN A1C
Est. average glucose Bld gHb Est-mCnc: 117 mg/dL
Hgb A1c MFr Bld: 5.7 % — ABNORMAL HIGH (ref 4.8–5.6)

## 2018-02-06 ENCOUNTER — Telehealth: Payer: Self-pay | Admitting: *Deleted

## 2018-02-06 NOTE — Telephone Encounter (Signed)
Mailed info to pt 

## 2018-02-06 NOTE — Telephone Encounter (Signed)
-----   Message from Purcell Nails, [place] sent at 02/06/2018  9:40 AM EST ----- Please mail info on pre-diabetes

## 2018-04-02 ENCOUNTER — Other Ambulatory Visit: Payer: Self-pay | Admitting: *Deleted

## 2018-04-02 MED ORDER — MONTELUKAST SODIUM 10 MG PO TABS: 10.0000 mg | ORAL_TABLET | Freq: Every day | ORAL | 3 refills | Status: DC

## 2018-04-19 ENCOUNTER — Telehealth: Payer: Self-pay

## 2018-04-19 NOTE — Telephone Encounter (Signed)
Please schedule WebEx for Dr. Patsy Lager.

## 2018-04-19 NOTE — Telephone Encounter (Signed)
Amber [name] has not been seen in our office since 03/10/2015.  Refill or set up WebEx Appt?

## 2018-04-19 NOTE — Telephone Encounter (Signed)
I have not seen her in a very long time. I don't feel comfortable refilling her medicine unless we have a visit.  In the current climate a web visit is more ideal.

## 2018-04-20 NOTE — Telephone Encounter (Signed)
Left message asking pt to call office see below messag

## 2018-04-24 ENCOUNTER — Other Ambulatory Visit: Payer: Self-pay | Admitting: *Deleted

## 2018-04-24 MED ORDER — SUMATRIPTAN SUCCINATE 50 MG PO TABS: 50.0000 mg | ORAL_TABLET | ORAL | 0 refills | Status: DC | PRN

## 2018-04-25 NOTE — Telephone Encounter (Signed)
Left message asking pt to call office  °

## 2018-05-01 ENCOUNTER — Encounter: Payer: Self-pay | Admitting: Family Medicine

## 2018-05-01 NOTE — Telephone Encounter (Signed)
Left message asking pt to call office mailed letter °

## 2018-05-01 NOTE — Telephone Encounter (Signed)
I tried calling pt again. Went to voice mail I sent my chart message asking pt to call office also mailed letter today

## 2018-07-03 ENCOUNTER — Other Ambulatory Visit: Payer: Self-pay | Admitting: *Deleted

## 2018-07-03 MED ORDER — MONTELUKAST SODIUM 10 MG PO TABS: 10.0000 mg | ORAL_TABLET | Freq: Every day | ORAL | 3 refills | Status: DC

## 2018-07-17 ENCOUNTER — Other Ambulatory Visit: Payer: Self-pay

## 2018-07-17 DIAGNOSIS — Z20822 Contact with and (suspected) exposure to covid-19: Secondary | ICD-10-CM

## 2018-07-17 NOTE — Progress Notes (Signed)
[id number] 

## 2018-07-23 LAB — NOVEL CORONAVIRUS, NAA: SARS-CoV-2, NAA: NOT DETECTED

## 2018-07-23 LAB — SPECIMEN STATUS REPORT

## 2018-07-30 ENCOUNTER — Other Ambulatory Visit: Payer: Self-pay

## 2018-07-30 DIAGNOSIS — Z20822 Contact with and (suspected) exposure to covid-19: Secondary | ICD-10-CM

## 2018-08-03 LAB — NOVEL CORONAVIRUS, NAA: SARS-CoV-2, NAA: NOT DETECTED

## 2018-08-13 ENCOUNTER — Telehealth: Payer: Self-pay | Admitting: Obstetrics and Gynecology

## 2018-08-13 NOTE — Telephone Encounter (Signed)
The patient called to get an estimate on the price of IUD/ Mirena removal w/o insurance.Pt aware Fox out of office, and that she will receive a call back tomorrow 08/14/18. Thank you.

## 2018-08-15 ENCOUNTER — Telehealth: Payer: Self-pay | Admitting: Obstetrics and Gynecology

## 2018-08-15 NOTE — Telephone Encounter (Signed)
Patient called stating at her thyroid labs were abnormal and is requesting a referral to Dr Gabriel Carina endocrinologist.Thanks

## 2018-08-29 ENCOUNTER — Telehealth: Payer: Self-pay | Admitting: *Deleted

## 2018-08-29 DIAGNOSIS — R7989 Other specified abnormal findings of blood chemistry: Secondary | ICD-10-CM

## 2018-08-29 NOTE — Telephone Encounter (Signed)
Pt would like referral to endo for abnormal thyroid labs

## 2018-08-29 NOTE — Telephone Encounter (Signed)
Referral sent 

## 2018-09-07 ENCOUNTER — Ambulatory Visit: Payer: BLUE CROSS/BLUE SHIELD | Admitting: Obstetrics and Gynecology

## 2018-09-21 ENCOUNTER — Ambulatory Visit: Payer: Self-pay | Admitting: Obstetrics and Gynecology

## 2018-10-12 ENCOUNTER — Ambulatory Visit: Payer: Self-pay | Admitting: Obstetrics and Gynecology

## 2018-10-12 ENCOUNTER — Other Ambulatory Visit: Payer: Self-pay

## 2018-10-12 ENCOUNTER — Encounter: Payer: Self-pay | Admitting: Obstetrics and Gynecology

## 2018-10-12 VITALS — BP 115/80 | HR 85 | Ht 69.0 in | Wt 325.6 lb

## 2018-10-12 DIAGNOSIS — Z30432 Encounter for removal of intrauterine contraceptive device: Secondary | ICD-10-CM

## 2018-10-12 MED ORDER — LEVONORGEST-ETH ESTRAD 91-DAY 0.15-0.03 MG PO TABS: 1.0000 | ORAL_TABLET | Freq: Every day | ORAL | 4 refills | Status: DC

## 2018-10-12 NOTE — Progress Notes (Signed)
Amber [name] is a 33 y.o. year old G18P1001 Caucasian female who presents for removal of a Mirena IUD. Her Mirena IUD was placed 2015.   No LMP recorded. (Menstrual status: IUD). BP 115/80   Pulse 85   Ht 5\' 9"  (1.753 m)   Wt (!) 325 lb 9 oz (147.7 kg)   BMI 48.08 kg/m   Time out was performed.  A graves speculum was placed in the vagina.  The cervix was visualized, and the strings were visible. They were grasped and the Mirena was easily removed intact without complications.   F/U in January for planned AE. Is switching to OCPs and rx for [facility] sent in to pharmacy. Instructed to use back up method for the first month.   Tanecia Mccay Rockney Ghee, CNM

## 2018-12-23 ENCOUNTER — Other Ambulatory Visit: Payer: Self-pay

## 2018-12-23 ENCOUNTER — Emergency Department
Admission: EM | Admit: 2018-12-23 | Discharge: 2018-12-23 | Disposition: A | Discharge status: Home / Self Care | Payer: Medicaid Other | Attending: Emergency Medicine | Admitting: Emergency Medicine

## 2018-12-23 ENCOUNTER — Emergency Department: Payer: Medicaid Other

## 2018-12-23 ENCOUNTER — Encounter: Payer: Self-pay | Admitting: Emergency Medicine

## 2018-12-23 DIAGNOSIS — Z91040 Latex allergy status: Secondary | ICD-10-CM | POA: Diagnosis not present

## 2018-12-23 DIAGNOSIS — Z79899 Other long term (current) drug therapy: Secondary | ICD-10-CM | POA: Diagnosis not present

## 2018-12-23 DIAGNOSIS — M545 Low back pain, unspecified: Secondary | ICD-10-CM

## 2018-12-23 LAB — HCG, QUANTITATIVE, PREGNANCY: hCG, Beta Chain, Quant, S: <1 m[IU]/mL (ref <5)

## 2018-12-23 MED ORDER — ORPHENADRINE CITRATE 30 MG/ML IJ SOLN
60.0000 mg | Freq: Once | INTRAMUSCULAR | Status: AC
Start: 2018-12-23 — End: 2018-12-23
  Administered 2018-12-23: 60 mg via INTRAVENOUS
  Filled 2018-12-23: qty 2

## 2018-12-23 MED ORDER — METHYLPREDNISOLONE SODIUM SUCC 125 MG IJ SOLR
125.0000 mg | Freq: Once | INTRAMUSCULAR | Status: AC
  Administered 2018-12-23: 125 mg via INTRAVENOUS
  Filled 2018-12-23: qty 2

## 2018-12-23 MED ORDER — PREDNISONE 10 MG (21) PO TBPK: ORAL_TABLET | ORAL | 0 refills | Status: DC

## 2018-12-23 MED ORDER — LIDOCAINE 5 % EX PTCH: 1.0000 | MEDICATED_PATCH | Freq: Two times a day (BID) | CUTANEOUS | 0 refills | Status: DC

## 2018-12-23 MED ORDER — ONDANSETRON HCL 4 MG/2ML IJ SOLN
4.0000 mg | Freq: Once | INTRAMUSCULAR | Status: AC
  Administered 2018-12-23: 4 mg via INTRAVENOUS
  Filled 2018-12-23: qty 2

## 2018-12-23 MED ORDER — MORPHINE SULFATE (PF) 4 MG/ML IV SOLN
4.0000 mg | Freq: Once | INTRAVENOUS | Status: AC
  Administered 2018-12-23: 4 mg via INTRAVENOUS
  Filled 2018-12-23: qty 1

## 2018-12-23 MED ORDER — LIDOCAINE 5 % EX PTCH
1.0000 | MEDICATED_PATCH | CUTANEOUS | Status: DC
  Administered 2018-12-23: 1 via TRANSDERMAL
  Filled 2018-12-23: qty 1

## 2018-12-23 MED ORDER — BACLOFEN 10 MG PO TABS: 10.0000 mg | ORAL_TABLET | Freq: Every day | ORAL | 1 refills | Status: DC

## 2018-12-23 MED ORDER — TRAMADOL HCL 50 MG PO TABS: 50.0000 mg | ORAL_TABLET | Freq: Four times a day (QID) | ORAL | 0 refills | Status: DC | PRN

## 2018-12-23 MED ORDER — MORPHINE SULFATE (PF) 4 MG/ML IV SOLN
4.0000 mg | Freq: Once | INTRAVENOUS | Status: AC
  Administered 2018-12-23: 15:00:00 4 mg via INTRAVENOUS
  Filled 2018-12-23: qty 1

## 2018-12-23 NOTE — ED Triage Notes (Addendum)
Pt arrived via POV with reports of low back pain radiating down both legs that started today after drying off after shower, pt reports hx of the same with pinched nerves.  Pt placed in wheelchair on arrival.

## 2018-12-23 NOTE — ED Provider Notes (Signed)
[facility] Emergency Department Provider Note  ____________________________________________   First MD Initiated Contact with Patient 12/23/18 1357     (approximate)  I have reviewed the triage vital signs and the nursing notes.   HISTORY  Chief Complaint Back Pain    HPI DELCIE Amber [name] is a 33 y.o. female presents emergency department complaining of severe lower back pain that radiates down both legs.  She states that her back locked up while getting out of the shower.  Pain but no numbness or tingling.  She states it is very difficult to go from sitting to standing.  She denies any loss of bladder or bowel control.    Past Medical History:  Diagnosis Date  . Anemia    iron deficiency  . Arthritis   . Asthma    as a child   . Family history of adverse reaction to anesthesia    mother gets n/v with anesthesia   . GERD (gastroesophageal reflux disease)   . Headache(784.0)   . Heart murmur    as a baby   . History of kidney stones   . Morbid obesity (HCC)   . Palpitations   . Stress fracture    Medial tibial plateau; mild medial retinacular sprain and PF lig sprain   . Thyroid disease     Patient Active Problem List   Diagnosis Date Noted  . Abdominal pain, epigastric 05/17/2016  . Atypical chest pain 05/17/2016  . Great toe pain 03/10/2015  . Right low back pain 01/08/2015  . Acute left flank pain 07/22/2014  . RUQ abdominal pain 01/06/2010  . HEARTBURN 08/14/2009  . ANXIETY STATE, UNSPECIFIED 03/27/2009  . PAROXYSMAL TACHYCARDIA 03/27/2009  . FRACTURE, TIBIAL PLATEAU 09/21/2008  . ANEMIA-IRON DEFICIENCY 04/17/2008  . GERD 04/17/2008  . OBESITY 12/04/2006  . Obstructive sleep apnea (adult) (pediatric) 12/04/2006  . TEMPOROMANDIBULAR JOINT PAIN 12/04/2006  . Migraine 10/05/2006    Past Surgical History:  Procedure Laterality Date  . LAPAROSCOPIC CHOLECYSTECTOMY SINGLE SITE WITH INTRAOPERATIVE CHOLANGIOGRAM N/A 07/05/2016   Procedure: LAPAROSCOPIC CHOLECYSTECTOMY  WITH INTRAOPERATIVE CHOLANGIOGRAM;  Surgeon: [name], MD;  Location: [facility] ORS;  Service: General;  Laterality: N/A;  . tubes in ears     multiple     Prior to Admission medications   Medication Sig Start Date End Date Taking? Authorizing Provider  baclofen (LIORESAL) 10 MG tablet Take 1 tablet (10 mg total) by mouth daily. 12/23/18 12/23/19  Alston Berrie, Roselyn Bering, PA-C  cetirizine (ZYRTEC) 10 MG tablet Take 10 mg by mouth daily.    [provider]  levonorgestrel-ethinyl estradiol (SEASONALE) 0.15-0.03 MG tablet Take 1 tablet by mouth daily. 10/12/18   Shambley, Melody N, CNM  levothyroxine (SYNTHROID) 50 MCG tablet Take by mouth. 09/05/18 09/05/19  [provider]  lidocaine (LIDODERM) 5 % Place 1 patch onto the skin every 12 (twelve) hours. Remove & Discard patch within 12 hours or as directed by MD 12/23/18 12/23/19  Sherrie Mustache, Roselyn Bering, PA-C  montelukast (SINGULAIR) 10 MG tablet Take 1 tablet (10 mg total) by mouth at bedtime. 07/03/18   Shambley, Melody N, CNM  pantoprazole (PROTONIX) 40 MG tablet TAKE ONE TABLET BY MOUTH TWICE DAILY BEFORE A MEAL 02/02/18   Shambley, Melody N, CNM  predniSONE (STERAPRED UNI-PAK 21 TAB) 10 MG (21) TBPK tablet Take 6 pills on day one then decrease by 1 pill each day 12/23/18   Faythe Ghee, PA-C  SUMAtriptan (IMITREX) 50 MG tablet Take 1 tablet (50  mg total) by mouth every 2 (two) hours as needed for migraine. May repeat in 2 hours if headache persists or recurs. 04/24/18   Shambley, Melody N, CNM  traMADol (ULTRAM) 50 MG tablet Take 1 tablet (50 mg total) by mouth every 6 (six) hours as needed. 12/23/18   Caryn Section Linden Dolin, PA-C    Allergies Dilaudid [hydromorphone hcl] and Latex  Family History  Problem Relation Age of Onset  . Hypertension Other        family hx   . Colon polyps Father     Social History Social History   Tobacco Use  . Smoking status: Never Smoker  . Smokeless tobacco: Never Used   Substance Use Topics  . Alcohol use: No    Alcohol/week: 0.0 standard drinks  . Drug use: No    Review of Systems  Constitutional: No fever/chills Eyes: No visual changes. ENT: No sore throat. Respiratory: Denies cough Genitourinary: Negative for dysuria. Musculoskeletal: Positive for back pain. Skin: Negative for rash.    ____________________________________________   PHYSICAL EXAM:  VITAL SIGNS: ED Triage Vitals  Enc Vitals Group     BP 12/23/18 1241 139/78     Pulse Rate 12/23/18 1241 95     Resp 12/23/18 1241 16     Temp 12/23/18 1241 98.9 F (37.2 C)     Temp Source 12/23/18 1241 Oral     SpO2 12/23/18 1241 97 %     Weight 12/23/18 1246 280 lb (127 kg)     Height 12/23/18 1246 5\' 9"  (1.753 m)     Head Circumference --      Peak Flow --      Pain Score 12/23/18 1246 10     Pain Loc --      Pain Edu? --      Excl. in [place]? --     Constitutional: Alert and oriented. Well appearing and in no acute distress. Eyes: Conjunctivae are normal.  Head: Atraumatic. Nose: No congestion/rhinnorhea. Mouth/Throat: Mucous membranes are moist.   Neck:  supple no lymphadenopathy noted Cardiovascular: Normal rate, regular rhythm.  Respiratory: Normal respiratory effort.  No retractions,  GU: deferred Musculoskeletal: Decreased range of motion secondary discomfort, patient has a lot of difficulty going from sitting to standing and has to walk very slowly.  She is able elevate both great toes.  5/5 strength in the lower legs.  Neurologic:  Normal speech and language.  Skin:  Skin is warm, dry and intact. No rash noted. Psychiatric: Mood and affect are normal. Speech and behavior are normal.  ____________________________________________   LABS (all labs ordered are listed, but only abnormal results are displayed)  Labs Reviewed  HCG, QUANTITATIVE, PREGNANCY   ____________________________________________   ____________________________________________  RADIOLOGY   X-ray lumbar spine shows increasing disc narrowing space at L5-S1  ____________________________________________   PROCEDURES  Procedure(s) performed: Saline lock, morphine 4 mg IV, Zofran 4 mg IV, Norflex 60 mg IV, Lidoderm patch   Procedures    ____________________________________________   INITIAL IMPRESSION / ASSESSMENT AND PLAN / ED COURSE  Pertinent labs & imaging results that were available during my care of the patient were reviewed by me and considered in my medical decision making (see chart for details).   Patient is 33 year old female presents emergency department with low back pain.  See HPI  Physical exam shows patient to be in decent amount of discomfort.  Difficulty going from sitting to standing.  Pain reproduced with movement.  Saline lock, morphine 4 mg  IV, Zofran 4 mg IV, Norflex 60 mg IV, Lidoderm patch    ----------------------------------------- 6:15 PM on 12/23/2018 -----------------------------------------  Patient appears to be more comfortable.  X-ray of the lumbar spine did show increasing narrowing of the disc space of L5-S1.  Explained the results to the patient.  She is given Solu-Medrol 125 mg IV to help decrease inflammation.  She will be given a prescription for Sterapred start tomorrow.  Prescription for baclofen and tramadol as needed.  Lidoderm patches as needed.  She was encouraged to follow-up with a neurosurgeon.  She is asking about a chiropractor told her this would be appropriate just be aware that he needs to evaluate her for a pinched nerve prior to adjusting her.  She states she understands and will comply.  She was discharged in stable condition.  Lynne Leaderichole M Tall was evaluated in Emergency Department on 12/23/2018 for the symptoms described in the history of present illness. She was evaluated in the context of the global COVID-19 pandemic, which necessitated consideration that the patient might be at risk for infection with the SARS-CoV-2  virus that causes COVID-19. Institutional protocols and algorithms that pertain to the evaluation of patients at risk for COVID-19 are in a state of rapid change based on information released by regulatory bodies including the CDC and federal and state organizations. These policies and algorithms were followed during the patient's care in the ED.   As part of my medical decision making, I reviewed the following data within the electronic MEDICAL RECORD NUMBER Nursing notes reviewed and incorporated, Labs reviewed , Old chart reviewed, Radiograph reviewed , Notes from prior ED visits and [place] Controlled Substance Database  ____________________________________________   FINAL CLINICAL IMPRESSION(S) / ED DIAGNOSES  Final diagnoses:  Acute midline low back pain without sciatica      NEW MEDICATIONS STARTED DURING THIS VISIT:  New Prescriptions   BACLOFEN (LIORESAL) 10 MG TABLET    Take 1 tablet (10 mg total) by mouth daily.   LIDOCAINE (LIDODERM) 5 %    Place 1 patch onto the skin every 12 (twelve) hours. Remove & Discard patch within 12 hours or as directed by MD   PREDNISONE (STERAPRED UNI-PAK 21 TAB) 10 MG (21) TBPK TABLET    Take 6 pills on day one then decrease by 1 pill each day   TRAMADOL (ULTRAM) 50 MG TABLET    Take 1 tablet (50 mg total) by mouth every 6 (six) hours as needed.     Note:  This document was prepared using Dragon voice recognition software and may include unintentional dictation errors.    Faythe GheeFisher, Laithan Conchas W, PA-C 12/23/18 Dorthy [id number]    Jessup, Charles, MD 12/24/18 [id number]

## 2018-12-23 NOTE — ED Notes (Signed)
Pt verbalized understanding of discharge instructions. NAD at this time. 

## 2018-12-23 NOTE — ED Notes (Signed)
First Nurse Note: Pt to ED c/o lower back pain. Pt is in NAD. Pt denies injury to the area.

## 2018-12-23 NOTE — Discharge Instructions (Signed)
Follow-up with your regular doctor, Dr. Lysle Morales neurosurgeon if not improving, return to the emergency department if worsening.  You may also want to consider chiropractor, Dr. Boykin Peek would be an option.  Take the medication as prescribed.  Apply wet heat followed by ice.  Try the back exercises when become a little more comfortable.

## 2019-01-07 ENCOUNTER — Other Ambulatory Visit: Payer: Self-pay

## 2019-01-07 MED ORDER — MONTELUKAST SODIUM 10 MG PO TABS: 10.0000 mg | ORAL_TABLET | Freq: Every day | ORAL | 0 refills | Status: DC

## 2019-01-25 ENCOUNTER — Encounter: Payer: Self-pay | Admitting: Obstetrics and Gynecology

## 2019-01-29 ENCOUNTER — Telehealth: Payer: Self-pay | Admitting: Certified Nurse Midwife

## 2019-02-11 ENCOUNTER — Other Ambulatory Visit: Payer: Self-pay | Admitting: Certified Nurse Midwife

## 2019-02-11 ENCOUNTER — Other Ambulatory Visit: Payer: Self-pay

## 2019-02-11 MED ORDER — PANTOPRAZOLE SODIUM 40 MG PO TBEC: DELAYED_RELEASE_TABLET | ORAL | 1 refills | Status: DC

## 2019-02-13 NOTE — Telephone Encounter (Signed)
error 

## 2019-03-15 ENCOUNTER — Encounter: Payer: Self-pay | Admitting: Certified Nurse Midwife

## 2019-03-22 ENCOUNTER — Encounter: Payer: Self-pay | Admitting: Certified Nurse Midwife

## 2019-03-29 ENCOUNTER — Encounter: Payer: Self-pay | Admitting: Certified Nurse Midwife

## 2019-03-29 ENCOUNTER — Ambulatory Visit (INDEPENDENT_AMBULATORY_CARE_PROVIDER_SITE_OTHER): Payer: Medicaid Other | Admitting: Certified Nurse Midwife

## 2019-03-29 ENCOUNTER — Other Ambulatory Visit: Payer: Self-pay

## 2019-03-29 VITALS — BP 111/74 | HR 88 | Ht 69.0 in | Wt 321.0 lb

## 2019-03-29 DIAGNOSIS — Z01419 Encounter for gynecological examination (general) (routine) without abnormal findings: Secondary | ICD-10-CM | POA: Diagnosis not present

## 2019-03-29 DIAGNOSIS — Z0000 Encounter for general adult medical examination without abnormal findings: Secondary | ICD-10-CM | POA: Diagnosis not present

## 2019-03-29 DIAGNOSIS — R7989 Other specified abnormal findings of blood chemistry: Secondary | ICD-10-CM

## 2019-03-29 DIAGNOSIS — E7800 Pure hypercholesterolemia, unspecified: Secondary | ICD-10-CM

## 2019-03-29 DIAGNOSIS — R7303 Prediabetes: Secondary | ICD-10-CM

## 2019-03-29 DIAGNOSIS — Z6841 Body Mass Index (BMI) 40.0 and over, adult: Secondary | ICD-10-CM

## 2019-03-29 MED ORDER — LEVONORGEST-ETH ESTRAD 91-DAY 0.15-0.03 MG PO TABS: 1.0000 | ORAL_TABLET | Freq: Every day | ORAL | 4 refills | Status: DC

## 2019-03-29 NOTE — Progress Notes (Signed)
ANNUAL PREVENTATIVE CARE GYN  ENCOUNTER NOTE  Subjective:       Amber [name] is a 34 y.o. G81P1001 female here for a routine annual gynecologic exam.  Current complaints: 1. Requests annual labs  Denies difficulty breathing or respiratory distress, chest pain, abdominal pain, excessive vaginal bleeding, dysuria, and leg pain.    Gynecologic History  Patient's last menstrual period was 01/29/2019 (exact date).  Period Cycle (Days): 90 Period Duration (Days): 5 Period Pattern: Regular Menstrual Flow: Moderate Menstrual Control: Maxi pad, Thin pad Dysmenorrhea: (!) Moderate Dysmenorrhea Symptoms: Cramping  Contraception: OCP (estrogen/progesterone), Seasonale  Last Pap: 01/2017. Results were: Neg/Neg  Obstetric History  OB History  Gravida Para Term Preterm AB Living  1 1 1     1   SAB TAB Ectopic Multiple Live Births          1    # Outcome Date GA Lbr Len/2nd Weight Sex Delivery Anes PTL Lv  1 Term 10/02/13   6 lb 11 oz (3.033 kg) F Vag-Spont EPI N LIV    Past Medical History:  Diagnosis Date  . Anemia    iron deficiency  . Arthritis   . Asthma    as a child   . Family history of adverse reaction to anesthesia    mother gets n/v with anesthesia   . GERD (gastroesophageal reflux disease)   . Headache(784.0)   . Heart murmur    as a baby   . History of kidney stones   . Morbid obesity ([place])   . Palpitations   . Stress fracture    Medial tibial plateau; mild medial retinacular sprain and PF lig sprain   . Thyroid disease     Past Surgical History:  Procedure Laterality Date  . LAPAROSCOPIC CHOLECYSTECTOMY SINGLE SITE WITH INTRAOPERATIVE CHOLANGIOGRAM N/A 07/05/2016   Procedure: LAPAROSCOPIC CHOLECYSTECTOMY  WITH INTRAOPERATIVE CHOLANGIOGRAM;  Surgeon: Johnathan Hausen, MD;  Location: [facility]S;  Service: General;  Laterality: N/A;  . tubes in ears     multiple     Current Outpatient Medications on File Prior to Visit  Medication Sig Dispense Refill  .  cetirizine (ZYRTEC) 10 MG tablet Take 10 mg by mouth daily.    Marland Kitchen levonorgestrel-ethinyl estradiol (SEASONALE) 0.15-0.03 MG tablet Take 1 tablet by mouth daily. 1 Package 4  . levothyroxine (SYNTHROID) 50 MCG tablet Take 50 mcg by mouth daily.     . montelukast (SINGULAIR) 10 MG tablet TAKE ONE TABLET AT BEDTIME *NEEDS APPT FOR ADDITIONAL REFILLS AFTER THIS ONE* 30 tablet 0  . Multiple Vitamins-Minerals (MULTIVITAMIN WITH MINERALS) tablet Take 1 tablet by mouth daily.    . pantoprazole (PROTONIX) 40 MG tablet TAKE ONE TABLET BY MOUTH TWICE DAILY BEFORE A MEAL 60 tablet 1  . SUMAtriptan (IMITREX) 50 MG tablet Take 1 tablet (50 mg total) by mouth every 2 (two) hours as needed for migraine. May repeat in 2 hours if headache persists or recurs. 10 tablet 0   No current facility-administered medications on file prior to visit.    Allergies  Allergen Reactions  . Dilaudid [Hydromorphone Hcl] Other (See Comments)    Hallucinations  . Latex Rash    Social History   Socioeconomic History  . Marital status: Married    Spouse name: Not on file  . Number of children: 1  . Years of education: Not on file  . Highest education level: Not on file  Occupational History  . Not on file  Tobacco Use  . Smoking  status: Never Smoker  . Smokeless tobacco: Never Used  Substance and Sexual Activity  . Alcohol use: No    Alcohol/week: 0.0 standard drinks  . Drug use: No  . Sexual activity: Yes    Birth control/protection: Pill  Other Topics Concern  . Not on file  Social History Narrative   Married; works as a Pharmacologist at [employer].    Social Determinants of Health   Financial Resource Strain:   . Difficulty of Paying Living Expenses:   Food Insecurity:   . Worried About Programme researcher, broadcasting/film/video in the Last Year:   . Barista in the Last Year:   Transportation Needs:   . Freight forwarder (Medical):   Marland Kitchen Lack of Transportation (Non-Medical):   Physical Activity:    . Days of Exercise per Week:   . Minutes of Exercise per Session:   Stress:   . Feeling of Stress :   Social Connections:   . Frequency of Communication with Friends and Family:   . Frequency of Social Gatherings with Friends and Family:   . Attends Religious Services:   . Active Member of Clubs or Organizations:   . Attends Banker Meetings:   Marland Kitchen Marital Status:   Intimate Partner Violence:   . Fear of Current or Ex-Partner:   . Emotionally Abused:   Marland Kitchen Physically Abused:   . Sexually Abused:     Family History  Problem Relation Age of Onset  . Hypertension Other        family hx   . Colon polyps Father   . Breast cancer Neg Hx   . Ovarian cancer Neg Hx   . Colon cancer Neg Hx     The following portions of the patient's history were reviewed and updated as appropriate: allergies, current medications, past family history, past medical history, past social history, past surgical history and problem list.  Review of Systems  ROS negative except as noted above. Information obtained from patient.    Objective:   BP 111/74   Pulse 88   Ht 5\' 9"  (1.753 m)   Wt (!) 321 lb (145.6 kg)   LMP 01/29/2019 (Exact Date)   BMI 47.40 kg/m    CONSTITUTIONAL: Well-developed, well-nourished female in no acute distress.   PSYCHIATRIC: Normal mood and affect. Normal behavior. Normal judgment and thought content.  NEUROLGIC: Alert and oriented to person, place, and time. Normal muscle tone coordination. No cranial nerve deficit noted.  HENT:  Normocephalic, atraumatic, External right and left ear normal.   EYES: Conjunctivae and EOM are normal. Pupils are equal and round.   NECK: Normal range of motion, supple, no masses.  Normal thyroid.   SKIN: Skin is warm and dry. No rash noted. Not diaphoretic. No erythema. No pallor.  CARDIOVASCULAR: Normal heart rate noted, regular rhythm, no murmur.  RESPIRATORY: Clear to auscultation bilaterally. Effort and breath sounds  normal, no problems with respiration noted.  BREASTS: Symmetric in size. No masses, skin changes, nipple drainage, or lymphadenopathy.  ABDOMEN: Soft, normal bowel sounds, no distention noted.  No tenderness, rebound or guarding.   PELVIC:  External Genitalia: Normal  Vagina: Normal  Cervix: Normal  Uterus: Normal  Adnexa: Normal   MUSCULOSKELETAL: Normal range of motion. No tenderness.  No cyanosis or clubbing.   LYMPHATIC: No Axillary, Supraclavicular, or Inguinal Adenopathy.  Assessment:   Annual gynecologic examination 34 y.o.   Contraception: OCP (estrogen/progesterone), Seasonale   Obesity 3  Problem List Items Addressed This Visit      Other   OBESITY   Relevant Orders   Thyroid Panel With TSH   Lipid panel   Hemoglobin A1c    Other Visit Diagnoses    Well woman exam    -  Primary   Relevant Orders   CBC   Thyroid Panel With TSH   Comprehensive metabolic panel   Lipid panel   Hemoglobin A1c   Elevated cholesterol       Relevant Orders   Lipid panel   Abnormal thyroid blood test       Relevant Orders   Thyroid Panel With TSH   Prediabetes       Relevant Orders   Hemoglobin A1c      Plan:   Pap: Not needed  Labs: See orders   Routine preventative health maintenance measures emphasized: Exercise/Diet/Weight control, Tobacco Warnings, Alcohol/Substance use risks and Stress Management; see AVS  Rx Seasonale, see orders  Return to Clinic - 1 Year   Gunnar Bulla, CNM [facility] 03/29/19 8:39 AM

## 2019-03-29 NOTE — Progress Notes (Signed)
Patient here for annual exam.  No complaints.  Patient requesting screening labs.

## 2019-03-29 NOTE — Patient Instructions (Addendum)
Thyroid-Stimulating Hormone Test Why am I having this test? You may have a thyroid-stimulating hormone (TSH) test if you have possible symptoms of abnormal thyroid hormone levels. This test can help your health care provider:  Diagnose a disorder of the thyroid gland or pituitary gland.  Manage your condition and treatment if you have an underactive thyroid (hypothyroidism) or an overactive thyroid (hyperthyroidism). Newborn babies may have this test done to screen for hypothyroidism that is present at birth (congenital). The thyroid is a gland in the lower front of the neck. It makes hormones that affect many body parts and systems, including the system that affects how quickly the body burns fuel for energy (metabolism). The pituitary gland is located just below the brain, behind the eyes and nasal passages. It helps maintain thyroid hormone levels and thyroid gland function. What is being tested? This test measures the amount of TSH in your blood. TSH may also be called thyrotropin. When the thyroid does not make enough hormones, the pituitary gland releases TSH into the bloodstream to stimulate the thyroid gland to make more hormones. What kind of sample is taken?     A blood sample is required for this test. It is usually collected by inserting a needle into a blood vessel. For newborns, a small amount of blood may be collected from the umbilical cord, or by using a small needle to prick the baby's heel (heel stick). Tell a health care provider about:  All medicines you are taking, including vitamins, herbs, eye drops, creams, and over-the-counter medicines.  Any blood disorders you have.  Any surgeries you have had.  Any medical conditions you have.  Whether you are pregnant or may be pregnant. How are the results reported? Your test results will be reported as a value that indicates how much TSH is in your blood. Your health care provider will compare your results to normal ranges  that were established after testing a large group of people (reference ranges). Reference ranges may vary among labs and hospitals. For this test, common reference ranges are:  Adult: 2-10 microunits/mL or 2-10 milliunits/L.  Newborn: ? Heel stick: 3-18 microunits/mL or 3-18 milliunits/L. ? Umbilical cord: 1-94 microunits/mL or 3-12 milliunits/L. What do the results mean? Results that are within the reference range are considered normal. This means that you have a normal amount of TSH in your blood. Results that are higher than the reference range mean that your TSH levels are too high. This may mean:  Your thyroid gland is not making enough thyroid hormones.  Your thyroid medicine dosage is too low.  You have a tumor on your pituitary gland. This is rare. Results that are lower than the reference range mean that your TSH levels are too low. This may be caused by hyperthyroidism or by a problem with the pituitary gland function. Talk with your health care provider about what your results mean. Questions to ask your health care provider Ask your health care provider, or the department that is doing the test:  When will my results be ready?  How will I get my results?  What are my treatment options?  What other tests do I need?  What are my next steps? Summary  You may have a thyroid-stimulating hormone (TSH) test if you have possible symptoms of abnormal thyroid hormone levels.  The thyroid is a gland in the lower front of the neck. It makes hormones that affect many body parts and systems.  The pituitary gland is located  just below the brain, behind the eyes and nasal passages. It helps maintain thyroid hormone levels and thyroid gland function.  This test measures the amount of TSH in your blood. TSH is made by the pituitary gland. It may also be called thyrotropin. This information is not intended to replace advice given to you by your health care provider. Make sure you  discuss any questions you have with your health care provider. Document Revised: 04/03/2017 Document Reviewed: 09/06/2016 Elsevier Patient Education  [place].   High Cholesterol  High cholesterol is a condition in which the blood has high levels of a white, waxy, fat-like substance (cholesterol). The human body needs small amounts of cholesterol. The liver makes all the cholesterol that the body needs. Extra (excess) cholesterol comes from the food that we eat. Cholesterol is carried from the liver by the blood through the blood vessels. If you have high cholesterol, deposits (plaques) may build up on the walls of your blood vessels (arteries). Plaques make the arteries narrower and stiffer. Cholesterol plaques increase your risk for heart attack and stroke. Work with your health care provider to keep your cholesterol levels in a healthy range. What increases the risk? This condition is more likely to develop in people who:  Eat foods that are high in animal fat (saturated fat) or cholesterol.  Are overweight.  Are not getting enough exercise.  Have a family history of high cholesterol. What are the signs or symptoms? There are no symptoms of this condition. How is this diagnosed? This condition may be diagnosed from the results of a blood test.  If you are older than age 35, your health care provider may check your cholesterol every 4-6 years.  You may be checked more often if you already have high cholesterol or other risk factors for heart disease. The blood test for cholesterol measures:  "Bad" cholesterol (LDL cholesterol). This is the main type of cholesterol that causes heart disease. The desired level for LDL is less than 100.  "Good" cholesterol (HDL cholesterol). This type helps to protect against heart disease by cleaning the arteries and carrying the LDL away. The desired level for HDL is 60 or higher.  Triglycerides. These are fats that the body can store or  burn for energy. The desired number for triglycerides is lower than 150.  Total cholesterol. This is a measure of the total amount of cholesterol in your blood, including LDL cholesterol, HDL cholesterol, and triglycerides. A healthy number is less than 200. How is this treated? This condition is treated with diet changes, lifestyle changes, and medicines. Diet changes  This may include eating more whole grains, fruits, vegetables, nuts, and fish.  This may also include cutting back on red meat and foods that have a lot of added sugar. Lifestyle changes  Changes may include getting at least 40 minutes of aerobic exercise 3 times a week. Aerobic exercises include walking, biking, and swimming. Aerobic exercise along with a healthy diet can help you maintain a healthy weight.  Changes may also include quitting smoking. Medicines  Medicines are usually given if diet and lifestyle changes have failed to reduce your cholesterol to healthy levels.  Your health care provider may prescribe a statin medicine. Statin medicines have been shown to reduce cholesterol, which can reduce the risk of heart disease. Follow these instructions at home: Eating and drinking If told by your health care provider:  Eat chicken (without skin), fish, veal, shellfish, ground [place] breast, and round or  loin cuts of red meat.  Do not eat fried foods or fatty meats, such as hot dogs and salami.  Eat plenty of fruits, such as apples.  Eat plenty of vegetables, such as broccoli, potatoes, and carrots.  Eat beans, peas, and lentils.  Eat grains such as barley, rice, couscous, and bulgur wheat.  Eat pasta without cream sauces.  Use skim or nonfat milk, and eat low-fat or nonfat yogurt and cheeses.  Do not eat or drink whole milk, cream, ice cream, egg yolks, or hard cheeses.  Do not eat stick margarine or tub margarines that contain trans fats (also called partially hydrogenated oils).  Do not eat  saturated tropical oils, such as coconut oil and palm oil.  Do not eat cakes, cookies, crackers, or other baked goods that contain trans fats.  General instructions  Exercise as directed by your health care provider. Increase your activity level with activities such as gardening, walking, and taking the stairs.  Take over-the-counter and prescription medicines only as told by your health care provider.  Do not use any products that contain nicotine or tobacco, such as cigarettes and e-cigarettes. If you need help quitting, ask your health care provider.  Keep all follow-up visits as told by your health care provider. This is important. Contact a health care provider if:  You are struggling to maintain a healthy diet or weight.  You need help to start on an exercise program.  You need help to stop smoking. Get help right away if:  You have chest pain.  You have trouble breathing. This information is not intended to replace advice given to you by your health care provider. Make sure you discuss any questions you have with your health care provider. Document Revised: 01/06/2017 Document Reviewed: 07/04/2015 Elsevier Patient Education  2020 [place].   Prediabetes Eating Plan Prediabetes is a condition that causes blood sugar (glucose) levels to be higher than normal. This increases the risk for developing diabetes. In order to prevent diabetes from developing, your health care provider may recommend a diet and other lifestyle changes to help you:  Control your blood glucose levels.  Improve your cholesterol levels.  Manage your blood pressure. Your health care provider may recommend working with a diet and nutrition specialist (dietitian) to make a meal plan that is best for you. What are tips for following this plan? Lifestyle  Set weight loss goals with the help of your health care team. It is recommended that most people with prediabetes lose 7% of their current body  weight.  Exercise for at least 30 minutes at least 5 days a week.  Attend a support group or seek ongoing support from a mental health counselor.  Take over-the-counter and prescription medicines only as told by your health care provider. Reading food labels  Read food labels to check the amount of fat, salt (sodium), and sugar in prepackaged foods. Avoid foods that have: ? Saturated fats. ? Trans fats. ? Added sugars.  Avoid foods that have more than 300 milligrams (mg) of sodium per serving. Limit your daily sodium intake to less than 2,300 mg each day. Shopping  Avoid buying pre-made and processed foods. Cooking  Cook with olive oil. Do not use butter, lard, or ghee.  Bake, broil, grill, or boil foods. Avoid frying. Meal planning   Work with your dietitian to develop an eating plan that is right for you. This may include: ? Tracking how many calories you take in. Use a food  diary, notebook, or mobile application to track what you eat at each meal. ? Using the glycemic index (GI) to plan your meals. The index tells you how quickly a food will raise your blood glucose. Choose low-GI foods. These foods take a longer time to raise blood glucose.  Consider following a Mediterranean diet. This diet includes: ? Several servings each day of fresh fruits and vegetables. ? Eating fish at least twice a week. ? Several servings each day of whole grains, beans, nuts, and seeds. ? Using olive oil instead of other fats. ? Moderate alcohol consumption. ? Eating small amounts of red meat and whole-fat dairy.  If you have high blood pressure, you may need to limit your sodium intake or follow a diet such as the DASH eating plan. DASH is an eating plan that aims to lower high blood pressure. What foods are recommended? The items listed below may not be a complete list. Talk with your dietitian about what dietary choices are best for you. Grains Whole grains, such as whole-wheat or  whole-grain breads, crackers, cereals, and pasta. Unsweetened oatmeal. Bulgur. Barley. Quinoa. Brown rice. Corn or whole-wheat flour tortillas or taco shells. Vegetables Lettuce. Spinach. Peas. Beets. Cauliflower. Cabbage. Broccoli. Carrots. Tomatoes. Squash. Eggplant. Herbs. Peppers. Onions. Cucumbers. Brussels sprouts. Fruits Berries. Bananas. Apples. Oranges. Grapes. Papaya. Mango. Pomegranate. Kiwi. Grapefruit. Cherries. Meats and other protein foods Seafood. Poultry without skin. Lean cuts of pork and beef. Tofu. Eggs. Nuts. Beans. Dairy Low-fat or fat-free dairy products, such as yogurt, cottage cheese, and cheese. Beverages Water. Tea. Coffee. Sugar-free or diet soda. Seltzer water. Lowfat or no-fat milk. Milk alternatives, such as soy or almond milk. Fats and oils Olive oil. Canola oil. Sunflower oil. Grapeseed oil. Avocado. Walnuts. Sweets and desserts Sugar-free or low-fat pudding. Sugar-free or low-fat ice cream and other frozen treats. Seasoning and other foods Herbs. Sodium-free spices. Mustard. Relish. Low-fat, low-sugar ketchup. Low-fat, low-sugar barbecue sauce. Low-fat or fat-free mayonnaise. What foods are not recommended? The items listed below may not be a complete list. Talk with your dietitian about what dietary choices are best for you. Grains Refined white flour and flour products, such as bread, pasta, snack foods, and cereals. Vegetables Canned vegetables. Frozen vegetables with butter or cream sauce. Fruits Fruits canned with syrup. Meats and other protein foods Fatty cuts of meat. Poultry with skin. Breaded or fried meat. Processed meats. Dairy Full-fat yogurt, cheese, or milk. Beverages Sweetened drinks, such as sweet iced tea and soda. Fats and oils Butter. Lard. Ghee. Sweets and desserts Baked goods, such as cake, cupcakes, pastries, cookies, and cheesecake. Seasoning and other foods Spice mixes with added salt. Ketchup. Barbecue sauce.  Mayonnaise. Summary  To prevent diabetes from developing, you may need to make diet and other lifestyle changes to help control blood sugar, improve cholesterol levels, and manage your blood pressure.  Set weight loss goals with the help of your health care team. It is recommended that most people with prediabetes lose 7 percent of their current body weight.  Consider following a Mediterranean diet that includes plenty of fresh fruits and vegetables, whole grains, beans, nuts, seeds, fish, lean meat, low-fat dairy, and healthy oils. This information is not intended to replace advice given to you by your health care provider. Make sure you discuss any questions you have with your health care provider. Document Revised: 04/27/2018 Document Reviewed: 03/09/2016 Elsevier Patient Education  [place].   Ethinyl Estradiol; Levonorgestrel tablets What is this medicine? ETHINYL ESTRADIOL; LEVONORGESTREL (  ETH in il es tra DYE ole; LEE voh nor jes trel) is an oral contraceptive. It combines two types of female hormones, an estrogen and a progestin. They are used to prevent ovulation and pregnancy. This medicine may be used for other purposes; ask your health care provider or pharmacist if you have questions. COMMON BRAND NAME(S): [place], [place], [place], [place], Amethia [place], [place], [place], [address], [place], [place], [place], Cam[place], [place], [place], [place], [place], [place], [place], [place], [place], [place], [place], [place], [place], [place], [place], [place], [place], Levonorgestrel/Ethinyl Estradiol, [place], [place], [place], [place], [place], [place], [place], [place], [place], [place], [place], [place], [place], [place], [place], [place], [place], [place], [place], [place], [place], [place] What should I tell my health care provider before I take this medicine? They need to know if you have or ever had any of these conditions:  abnormal vaginal  bleeding  blood vessel disease or blood clots  breast, cervical, endometrial, ovarian, liver, or uterine cancer  diabetes  gallbladder disease  heart disease or recent heart attack  high blood pressure  high cholesterol  kidney disease  liver disease  migraine headaches  stroke  systemic lupus erythematosus (SLE)  tobacco smoker  an unusual or allergic reaction to estrogens, progestins, other medicines, foods, dyes, or preservatives  pregnant or trying to get pregnant  breast-feeding How should I use this medicine? Take this medicine by mouth. To reduce nausea, this medicine may be taken with food. Follow the directions on the prescription label. Take this medicine at the same time each day and in the order directed on the package. Do not take your medicine more often than directed. Contact your pediatrician regarding the use of this medicine in children. Special care may be needed. This medicine has been used in female children who have started having menstrual periods. A patient package insert for the product will be given with each prescription and refill. Read this sheet carefully each time. The sheet may change frequently. Overdosage: If you think you have taken too much of this medicine contact a poison control center or emergency room at once. NOTE: This medicine is only for you. Do not share this medicine with others. What if I miss a dose? If you miss a dose, refer to the patient information sheet you received with your medicine for direction. If you miss more than one pill, this medicine may not be as effective and you may need to use another form of birth control. What may interact with this medicine? Do not take this medicine with the following medication:  dasabuvir; ombitasvir; paritaprevir; ritonavir  ombitasvir; paritaprevir; ritonavir This medicine may also interact with the following medications:  acetaminophen  antibiotics or medicines for  infections, especially rifampin, rifabutin, rifapentine, and griseofulvin, and possibly penicillins or tetracyclines  aprepitant  ascorbic acid (vitamin C)  atorvastatin  barbiturate medicines, such as phenobarbital  bosentan  carbamazepine  caffeine  clofibrate  cyclosporine  dantrolene  doxercalciferol  felbamate  grapefruit juice  hydrocortisone  medicines for anxiety or sleeping problems, such as diazepam or temazepam  medicines for diabetes, including pioglitazone  mineral oil  modafinil  mycophenolate  nefazodone  oxcarbazepine  phenytoin  prednisolone  ritonavir or other medicines for HIV infection or AIDS  rosuvastatin  selegiline  soy isoflavones supplements  St. John's wort  tamoxifen or raloxifene  theophylline  thyroid hormones  topiramate  warfarin This list may not describe all possible interactions. Give your health care provider a list of all the medicines, herbs, non-prescription drugs, or dietary  supplements you use. Also tell them if you smoke, drink alcohol, or use illegal drugs. Some items may interact with your medicine. What should I watch for while using this medicine? Visit your doctor or health care professional for regular checks on your progress. You will need a regular breast and pelvic exam and Pap smear while on this medicine. Use an additional method of contraception during the first cycle that you take these tablets. If you have any reason to think you are pregnant, stop taking this medicine right away and contact your doctor or health care professional. If you are taking this medicine for hormone related problems, it may take several cycles of use to see improvement in your condition. Smoking increases the risk of getting a blood clot or having a stroke while you are taking birth control pills, especially if you are more than 35 years old. You are strongly advised not to smoke. This medicine can make your body  retain fluid, making your fingers, hands, or ankles swell. Your blood pressure can go up. Contact your doctor or health care professional if you feel you are retaining fluid. This medicine can make you more sensitive to the sun. Keep out of the sun. If you cannot avoid being in the sun, wear protective clothing and use sunscreen. Do not use sun lamps or tanning beds/booths. If you wear contact lenses and notice visual changes, or if the lenses begin to feel uncomfortable, consult your eye care specialist. In some women, tenderness, swelling, or minor bleeding of the gums may occur. Notify your dentist if this happens. Brushing and flossing your teeth regularly may help limit this. See your dentist regularly and inform your dentist of the medicines you are taking. If you are going to have elective surgery, you may need to stop taking this medicine before the surgery. Consult your health care professional for advice. This medicine does not protect you against HIV infection (AIDS) or any other sexually transmitted diseases. What side effects may I notice from receiving this medicine? Side effects that you should report to your doctor or health care professional as soon as possible:  breast tissue changes or discharge  changes in vaginal bleeding during your period or between your periods  chest pain  coughing up blood  dizziness or fainting spells  headaches or migraines  leg, arm or groin pain  severe or sudden headaches  stomach pain (severe)  sudden shortness of breath  sudden loss of coordination, especially on one side of the body  speech problems  symptoms of vaginal infection like itching, irritation or unusual discharge  tenderness in the upper abdomen  vomiting  weakness or numbness in the arms or legs, especially on one side of the body  yellowing of the eyes or skin Side effects that usually do not require medical attention (report to your doctor or health care  professional if they continue or are bothersome):  breakthrough bleeding and spotting that continues beyond the 3 initial cycles of pills  breast tenderness  mood changes, anxiety, depression, frustration, anger, or emotional outbursts  increased sensitivity to sun or ultraviolet light  nausea  skin rash, acne, or brown spots on the skin  weight gain (slight) This list may not describe all possible side effects. Call your doctor for medical advice about side effects. You may report side effects to FDA at 1-800-FDA-1088. Where should I keep my medicine? Keep out of the reach of children. Store at room temperature between 15 and 30 degrees  C (59 and 86 degrees F). Throw away any unused medicine after the expiration date. NOTE: This sheet is a summary. It may not cover all possible information. If you have questions about this medicine, talk to your doctor, pharmacist, or health care provider.  2020 Elsevier/Gold Standard (2015-09-14 07:58:22)   Preventive Care 63-65 Years Old, Female Preventive care refers to visits with your health care provider and lifestyle choices that can promote health and wellness. This includes:  A yearly physical exam. This may also be called an annual well check.  Regular dental visits and eye exams.  Immunizations.  Screening for certain conditions.  Healthy lifestyle choices, such as eating a healthy diet, getting regular exercise, not using drugs or products that contain nicotine and tobacco, and limiting alcohol use. What can I expect for my preventive care visit? Physical exam Your health care provider will check your:  Height and weight. This may be used to calculate body mass index (BMI), which tells if you are at a healthy weight.  Heart rate and blood pressure.  Skin for abnormal spots. Counseling Your health care provider may ask you questions about your:  Alcohol, tobacco, and drug use.  Emotional well-being.  Home and  relationship well-being.  Sexual activity.  Eating habits.  Work and work Statistician.  Method of birth control.  Menstrual cycle.  Pregnancy history. What immunizations do I need?  Influenza (flu) vaccine  This is recommended every year. Tetanus, diphtheria, and pertussis (Tdap) vaccine  You may need a Td booster every 10 years. Varicella (chickenpox) vaccine  You may need this if you have not been vaccinated. Human papillomavirus (HPV) vaccine  If recommended by your health care provider, you may need three doses over 6 months. Measles, mumps, and rubella (MMR) vaccine  You may need at least one dose of MMR. You may also need a second dose. Meningococcal conjugate (MenACWY) vaccine  One dose is recommended if you are age 31-21 years and a first-year college student living in a residence hall, or if you have one of several medical conditions. You may also need additional booster doses. Pneumococcal conjugate (PCV13) vaccine  You may need this if you have certain conditions and were not previously vaccinated. Pneumococcal polysaccharide (PPSV23) vaccine  You may need one or two doses if you smoke cigarettes or if you have certain conditions. Hepatitis A vaccine  You may need this if you have certain conditions or if you travel or work in places where you may be exposed to hepatitis A. Hepatitis B vaccine  You may need this if you have certain conditions or if you travel or work in places where you may be exposed to hepatitis B. Haemophilus influenzae type b (Hib) vaccine  You may need this if you have certain conditions. You may receive vaccines as individual doses or as more than one vaccine together in one shot (combination vaccines). Talk with your health care provider about the risks and benefits of combination vaccines. What tests do I need?  Blood tests  Lipid and cholesterol levels. These may be checked every 5 years starting at age 3.  Hepatitis C  test.  Hepatitis B test. Screening  Diabetes screening. This is done by checking your blood sugar (glucose) after you have not eaten for a while (fasting).  Sexually transmitted disease (STD) testing.  BRCA-related cancer screening. This may be done if you have a family history of breast, ovarian, tubal, or peritoneal cancers.  Pelvic exam and Pap test. This  may be done every 3 years starting at age 45. Starting at age 28, this may be done every 5 years if you have a Pap test in combination with an HPV test. Talk with your health care provider about your test results, treatment options, and if necessary, the need for more tests. Follow these instructions at home: Eating and drinking   Eat a diet that includes fresh fruits and vegetables, whole grains, lean protein, and low-fat dairy.  Take vitamin and mineral supplements as recommended by your health care provider.  Do not drink alcohol if: ? Your health care provider tells you not to drink. ? You are pregnant, may be pregnant, or are planning to become pregnant.  If you drink alcohol: ? Limit how much you have to 0-1 drink a day. ? Be aware of how much alcohol is in your drink. In the [place] one drink equals one 12 oz bottle of beer (355 mL), one 5 oz glass of wine (148 mL), or one 1 oz glass of hard liquor (44 mL). Lifestyle  Take daily care of your teeth and gums.  Stay active. Exercise for at least 30 minutes on 5 or more days each week.  Do not use any products that contain nicotine or tobacco, such as cigarettes, e-cigarettes, and chewing tobacco. If you need help quitting, ask your health care provider.  If you are sexually active, practice safe sex. Use a condom or other form of birth control (contraception) in order to prevent pregnancy and STIs (sexually transmitted infections). If you plan to become pregnant, see your health care provider for a preconception visit. What's next?  Visit your health care provider once a  year for a well check visit.  Ask your health care provider how often you should have your eyes and teeth checked.  Stay up to date on all vaccines. This information is not intended to replace advice given to you by your health care provider. Make sure you discuss any questions you have with your health care provider. Document Revised: 09/14/2017 Document Reviewed: 09/14/2017 Elsevier Patient Education  2020 [employer].

## 2019-03-30 LAB — THYROID PANEL WITH TSH
Free Thyroxine Index: 2.5 (ref 1.2–4.9)
T3 Uptake Ratio: 19 % — ABNORMAL LOW (ref 24–39)
T4, Total: 13.4 ug/dL — ABNORMAL HIGH (ref 4.5–12.0)
TSH: 4.9 u[IU]/mL — ABNORMAL HIGH (ref 0.450–4.500)

## 2019-03-30 LAB — LIPID PANEL
Chol/HDL Ratio: 3.7 ratio (ref 0.0–4.4)
Cholesterol, Total: 201 mg/dL — ABNORMAL HIGH (ref 100–199)
HDL: 55 mg/dL (ref >39)
LDL Chol Calc (NIH): 126 mg/dL — ABNORMAL HIGH (ref 0–99)
Triglycerides: 112 mg/dL (ref 0–149)
VLDL Cholesterol Cal: 20 mg/dL (ref 5–40)

## 2019-03-30 LAB — COMPREHENSIVE METABOLIC PANEL WITH GFR
ALT: 17 IU/L (ref 0–32)
AST: 16 IU/L (ref 0–40)
Albumin/Globulin Ratio: 1.6 (ref 1.2–2.2)
Albumin: 4.2 g/dL (ref 3.8–4.8)
Alkaline Phosphatase: 50 IU/L (ref 39–117)
BUN/Creatinine Ratio: 13 (ref 9–23)
BUN: 10 mg/dL (ref 6–20)
Bilirubin Total: 0.4 mg/dL (ref 0.0–1.2)
CO2: 22 mmol/L (ref 20–29)
Calcium: 9.3 mg/dL (ref 8.7–10.2)
Chloride: 104 mmol/L (ref 96–106)
Creatinine, Ser: 0.76 mg/dL (ref 0.57–1.00)
GFR calc Af Amer: 118 mL/min/1.73 (ref >59)
GFR calc non Af Amer: 103 mL/min/1.73 (ref >59)
Globulin, Total: 2.7 g/dL (ref 1.5–4.5)
Glucose: 75 mg/dL (ref 65–99)
Potassium: 4.4 mmol/L (ref 3.5–5.2)
Sodium: 139 mmol/L (ref 134–144)
Total Protein: 6.9 g/dL (ref 6.0–8.5)

## 2019-03-30 LAB — CBC
Hematocrit: 38.4 % (ref 34.0–46.6)
Hemoglobin: 12.5 g/dL (ref 11.1–15.9)
MCH: 29.2 pg (ref 26.6–33.0)
MCHC: 32.6 g/dL (ref 31.5–35.7)
MCV: 90 fL (ref 79–97)
Platelets: 331 10*3/uL (ref 150–450)
RBC: 4.28 x10E6/uL (ref 3.77–5.28)
RDW: 13.7 % (ref 11.7–15.4)
WBC: 7.8 10*3/uL (ref 3.4–10.8)

## 2019-03-30 LAB — HEMOGLOBIN A1C
Est. average glucose Bld gHb Est-mCnc: 111 mg/dL
Hgb A1c MFr Bld: 5.5 % (ref 4.8–5.6)

## 2019-04-01 ENCOUNTER — Other Ambulatory Visit: Payer: Self-pay | Admitting: Certified Nurse Midwife

## 2019-04-12 ENCOUNTER — Other Ambulatory Visit (INDEPENDENT_AMBULATORY_CARE_PROVIDER_SITE_OTHER): Payer: Medicaid Other | Admitting: Certified Nurse Midwife

## 2019-04-12 DIAGNOSIS — Z6841 Body Mass Index (BMI) 40.0 and over, adult: Secondary | ICD-10-CM

## 2019-04-12 NOTE — Progress Notes (Signed)
Referral placed to Medical Nutrition Therapy. Patient interested in starting weight loss program.    Gunnar Bulla, CNM [facility], [facility] 04/12/19 8:39 AM

## 2019-05-09 ENCOUNTER — Other Ambulatory Visit: Payer: Self-pay

## 2019-05-09 ENCOUNTER — Encounter: Payer: Self-pay | Admitting: Dietician

## 2019-05-09 ENCOUNTER — Encounter: Payer: Medicaid Other | Attending: Certified Nurse Midwife | Admitting: Dietician

## 2019-05-09 VITALS — Ht 69.0 in | Wt 308.4 lb

## 2019-05-09 DIAGNOSIS — Z713 Dietary counseling and surveillance: Secondary | ICD-10-CM | POA: Diagnosis present

## 2019-05-09 DIAGNOSIS — Z6841 Body Mass Index (BMI) 40.0 and over, adult: Secondary | ICD-10-CM | POA: Insufficient documentation

## 2019-05-09 NOTE — Progress Notes (Signed)
Medical Nutrition Therapy: Visit start time: 1100  end time: 1200  Assessment:  Diagnosis: obesity Past medical history: GERD, migraines, seasonal allergies Psychosocial issues/ stress concerns: none  Preferred learning method:  . Auditory . Visual  Current weight: 308.4lbs Height: 5'9" Medications, supplements: reconciled list in medical record  Progress and evaluation:   Patient has begun a keto diet in the past several weeks (significant decrease in carb and sugar intake), has lost 10-12lbs and reports feeling better in general, less reflux with reduction in fried foods.   Did a partial liquid diet prior to pregancy and lost 60lbs but regained during and after pregnancy.    She is considering weight loss medication if lifestyle change does not help her reach weight loss goal. She has set a goal of reaching 200lbs for now.  Physical activity: no structured activity, some active play with daughter  Dietary Intake:  Usual eating pattern includes 2 meals and 1-2 snacks per day. Dining out frequency: 2-5 meals per week.  Breakfast: usually none, historically only occasional biscuit Snack: skinny pop popcorn, cashews, granola bar Lunch: 2pm on workdays-- chicken wrap or salad; leftovers (used to eat fast foods) Snack: none Supper: steak, grilled pork chop or chicken + steamed veg or salad Snack: usually none or keto ice cream or popcorn Beverages: water, diet soda (was drinking regular sodas)  Nutrition Care Education: Topics covered:  Basic nutrition: basic food groups, appropriate nutrient balance -- discussed role of carb, protein, and fat, appropriate meal and snack schedule, general nutrition guidelines    Weight control: identifying healthy weight, determining reasonable weight loss rate, importance of low sugar and low fat choices, portion control strategies including use of smaller plates/ containers, estimated energy needs for weight loss at 1500-1700 kcal, provided  guidance for 40% CHO, 30% protein, and 30% fat; discussed role of physical activity   Nutritional Diagnosis:  [place]-3.3 Overweight/obesity As related to history of excess calories and inadequate physical activity.  As evidenced by patient with current BMI of 45.54, working on diet and lifestyle changes to promote weight loss.  Intervention:   Instruction and discussion as noted above.  Patient has noticed some weight loss with diet changes she has already been making.  Established goals for additional change with direction from patient.  No follow-up scheduled at this time due to upcoming change in health insurance coverage; patient will schedule later if needed.  Education Materials given:  . Plate Planner with food lists, sample meal pattern . Sample menus . Goals/ instructions   Learner/ who was taught:  . Patient   Level of understanding: Marland Kitchen Verbalizes/ demonstrates competency   Demonstrated degree of understanding via:   Teach back Learning barriers: . None  Willingness to learn/ readiness for change: . Eager, change in progress  Monitoring and Evaluation:  Dietary intake, exercise, and body weight      follow up: prn

## 2019-05-09 NOTE — Patient Instructions (Signed)
   Work towards healthy balance of carb, protein, and fat in meals by allowing small - moderate portions of healthy carbs (fist size, or 1 cup or less) + lean protein foods, plenty of low carb veggies and small amounts of added fats when needed.   OK to eliminate some "trigger" foods initially; long term inclusion of all foods is healthy and helps to avoid feelings of guilt.   Goal for daily fluid intake is 64oz or more.   Gradually increase physical activity and exercise. Start with a short duration like 10-15 minutes and increase as able.

## 2019-05-17 ENCOUNTER — Ambulatory Visit: Payer: Self-pay | Admitting: Dietician

## 2019-05-17 ENCOUNTER — Telehealth: Payer: Self-pay | Admitting: Certified Nurse Midwife

## 2019-05-17 DIAGNOSIS — Z6841 Body Mass Index (BMI) 40.0 and over, adult: Secondary | ICD-10-CM

## 2019-05-17 DIAGNOSIS — Z7689 Persons encountering health services in other specified circumstances: Secondary | ICD-10-CM

## 2019-05-17 MED ORDER — PHENTERMINE HCL 15 MG PO CAPS: 15.0000 mg | ORAL_CAPSULE | ORAL | 0 refills | Status: DC

## 2019-05-17 MED ORDER — TOPIRAMATE 25 MG PO CPSP: 25.0000 mg | ORAL_CAPSULE | Freq: Every day | ORAL | 0 refills | Status: DC

## 2019-05-17 NOTE — Addendum Note (Signed)
Addended by: Shaune Spittle on: 05/17/2019 02:53 PM   Modules accepted: Orders

## 2019-05-17 NOTE — Telephone Encounter (Signed)
Completed Nutrition consult as required. Starting medical weight loss program with phentermine and Topamax, see orders.   Gunnar Bulla, CNM [facility], [facility] 05/17/19 2:53 PM

## 2019-05-17 NOTE — Telephone Encounter (Signed)
Patient called requesting a status update on Rx she has requested. Her pharmacy of preference is [facility].

## 2019-05-27 ENCOUNTER — Other Ambulatory Visit: Payer: Self-pay | Admitting: Certified Nurse Midwife

## 2019-05-27 DIAGNOSIS — Z6841 Body Mass Index (BMI) 40.0 and over, adult: Secondary | ICD-10-CM

## 2019-05-27 NOTE — Progress Notes (Unsigned)
Rx: Phentermine, see orders.    Gunnar Bulla, CNM [facility], [facility] 05/27/19 5:40 PM

## 2019-05-29 ENCOUNTER — Other Ambulatory Visit (INDEPENDENT_AMBULATORY_CARE_PROVIDER_SITE_OTHER): Payer: Medicaid Other | Admitting: Certified Nurse Midwife

## 2019-05-29 DIAGNOSIS — Z6841 Body Mass Index (BMI) 40.0 and over, adult: Secondary | ICD-10-CM

## 2019-05-29 MED ORDER — PHENTERMINE HCL 30 MG PO CAPS: 30.0000 mg | ORAL_CAPSULE | ORAL | 0 refills | Status: DC

## 2019-05-29 NOTE — Progress Notes (Signed)
Rx: Phentermine, see orders.    Gunnar Bulla, CNM [facility], [facility] 05/29/19 1:35 PM

## 2019-06-07 ENCOUNTER — Encounter: Payer: Medicaid Other | Admitting: Certified Nurse Midwife

## 2019-06-13 ENCOUNTER — Encounter: Payer: Self-pay | Admitting: Certified Nurse Midwife

## 2019-06-13 ENCOUNTER — Ambulatory Visit (INDEPENDENT_AMBULATORY_CARE_PROVIDER_SITE_OTHER): Payer: Medicaid Other | Admitting: Certified Nurse Midwife

## 2019-06-13 ENCOUNTER — Other Ambulatory Visit: Payer: Self-pay

## 2019-06-13 VITALS — BP 118/81 | HR 83 | Ht 69.0 in | Wt 295.0 lb

## 2019-06-13 DIAGNOSIS — Z6841 Body Mass Index (BMI) 40.0 and over, adult: Secondary | ICD-10-CM | POA: Diagnosis not present

## 2019-06-13 DIAGNOSIS — R634 Abnormal weight loss: Secondary | ICD-10-CM | POA: Diagnosis not present

## 2019-06-13 DIAGNOSIS — Z7689 Persons encountering health services in other specified circumstances: Secondary | ICD-10-CM | POA: Diagnosis not present

## 2019-06-13 MED ORDER — PHENTERMINE HCL 30 MG PO CAPS: 30.0000 mg | ORAL_CAPSULE | ORAL | 1 refills | Status: DC

## 2019-06-13 MED ORDER — CYANOCOBALAMIN 1000 MCG/ML IJ SOLN
1000.0000 ug | Freq: Once | INTRAMUSCULAR | Status: AC
  Administered 2019-06-13: 1000 ug via INTRAMUSCULAR

## 2019-06-13 MED ORDER — TOPIRAMATE 25 MG PO CPSP: 25.0000 mg | ORAL_CAPSULE | Freq: Every day | ORAL | 0 refills | Status: DC

## 2019-06-13 NOTE — Patient Instructions (Signed)
Exercising to Lose Weight Exercise is structured, repetitive physical activity to improve fitness and health. Getting regular exercise is important for everyone. It is especially important if you are overweight. Being overweight increases your risk of heart disease, stroke, diabetes, high blood pressure, and several types of cancer. Reducing your calorie intake and exercising can help you lose weight. Exercise is usually categorized as moderate or vigorous intensity. To lose weight, most people need to do a certain amount of moderate-intensity or vigorous-intensity exercise each week. Moderate-intensity exercise  Moderate-intensity exercise is any activity that gets you moving enough to burn at least three times more energy (calories) than if you were sitting. Examples of moderate exercise include:  Walking a mile in 15 minutes.  Doing light yard work.  Biking at an easy pace. Most people should get at least 150 minutes (2 hours and 30 minutes) a week of moderate-intensity exercise to maintain their body weight. Vigorous-intensity exercise Vigorous-intensity exercise is any activity that gets you moving enough to burn at least six times more calories than if you were sitting. When you exercise at this intensity, you should be working hard enough that you are not able to carry on a conversation. Examples of vigorous exercise include:  Running.  Playing a team sport, such as football, basketball, and soccer.  Jumping rope. Most people should get at least 75 minutes (1 hour and 15 minutes) a week of vigorous-intensity exercise to maintain their body weight. How can exercise affect me? When you exercise enough to burn more calories than you eat, you lose weight. Exercise also reduces body fat and builds muscle. The more muscle you have, the more calories you burn. Exercise also:  Improves mood.  Reduces stress and tension.  Improves your overall fitness, flexibility, and  endurance.  Increases bone strength. The amount of exercise you need to lose weight depends on:  Your age.  The type of exercise.  Any health conditions you have.  Your overall physical ability. Talk to your health care provider about how much exercise you need and what types of activities are safe for you. What actions can I take to lose weight? Nutrition   Make changes to your diet as told by your health care provider or diet and nutrition specialist (dietitian). This may include: ? Eating fewer calories. ? Eating more protein. ? Eating less unhealthy fats. ? Eating a diet that includes fresh fruits and vegetables, whole grains, low-fat dairy products, and lean protein. ? Avoiding foods with added fat, salt, and sugar.  Drink plenty of water while you exercise to prevent dehydration or heat stroke. Activity  Choose an activity that you enjoy and set realistic goals. Your health care provider can help you make an exercise plan that works for you.  Exercise at a moderate or vigorous intensity most days of the week. ? The intensity of exercise may vary from person to person. You can tell how intense a workout is for you by paying attention to your breathing and heartbeat. Most people will notice their breathing and heartbeat get faster with more intense exercise.  Do resistance training twice each week, such as: ? Push-ups. ? Sit-ups. ? Lifting weights. ? Using resistance bands.  Getting short amounts of exercise can be just as helpful as long structured periods of exercise. If you have trouble finding time to exercise, try to include exercise in your daily routine. ? Get up, stretch, and walk around every 30 minutes throughout the day. ? Go for a  walk during your lunch break. ? Park your car farther away from your destination. ? If you take public transportation, get off one stop early and walk the rest of the way. ? Make phone calls while standing up and walking  around. ? Take the stairs instead of elevators or escalators.  Wear comfortable clothes and shoes with good support.  Do not exercise so much that you hurt yourself, feel dizzy, or get very short of breath. Where to find more information  U.S[place] Department of Health and Human Services: [employer]at  Centers for Disease Control and Prevention (CDC): http://www.wolf.info/ Contact a health care provider:  Before starting a new exercise program.  If you have questions or concerns about your weight.  If you have a medical problem that keeps you from exercising. Get help right away if you have any of the following while exercising:  Injury.  Dizziness.  Difficulty breathing or shortness of breath that does not go away when you stop exercising.  Chest pain.  Rapid heartbeat. Summary  Being overweight increases your risk of heart disease, stroke, diabetes, high blood pressure, and several types of cancer.  Losing weight happens when you burn more calories than you eat.  Reducing the amount of calories you eat in addition to getting regular moderate or vigorous exercise each week helps you lose weight. This information is not intended to replace advice given to you by your health care provider. Make sure you discuss any questions you have with your health care provider. Document Revised: 01/16/2017 Document Reviewed: 01/16/2017 Elsevier Patient Education  2020 [place] [employer] Calories are units of energy. Your body needs a certain amount of calories from food to keep you going throughout the day. When you eat more calories than your body needs, your body stores the extra calories as fat. When you eat fewer calories than your body needs, your body burns fat to get the energy it needs. Calorie counting means keeping track of how many calories you eat and drink each day. Calorie counting can be helpful if you need to lose weight. If you make sure to eat fewer  calories than your body needs, you should lose weight. Ask your health care provider what a healthy weight is for you. For calorie counting to work, you will need to eat the right number of calories in a day in order to lose a healthy amount of weight per week. A dietitian can help you determine how many calories you need in a day and will give you suggestions on how to reach your calorie goal.  A healthy amount of weight to lose per week is usually 1-2 lb (0.5-0.9 kg). This usually means that your daily calorie intake should be reduced by 500-750 calories.  Eating 1,200 - 1,500 calories per day can help most women lose weight.  Eating 1,500 - 1,800 calories per day can help most men lose weight. What is my plan? My goal is to have __________ calories per day. If I have this many calories per day, I should lose around __________ pounds per week. What do I need to know about calorie counting? In order to meet your daily calorie goal, you will need to:  Find out how many calories are in each food you would like to eat. Try to do this before you eat.  Decide how much of the food you plan to eat.  Write down what you ate and how many calories it had.  Doing this is called keeping a food log. To successfully lose weight, it is important to balance calorie counting with a healthy lifestyle that includes regular activity. Aim for 150 minutes of moderate exercise (such as walking) or 75 minutes of vigorous exercise (such as running) each week. Where do I find calorie information?  The number of calories in a food can be found on a Nutrition Facts label. If a food does not have a Nutrition Facts label, try to look up the calories online or ask your dietitian for help. Remember that calories are listed per serving. If you choose to have more than one serving of a food, you will have to multiply the calories per serving by the amount of servings you plan to eat. For example, the label on a package of  bread might say that a serving size is 1 slice and that there are 90 calories in a serving. If you eat 1 slice, you will have eaten 90 calories. If you eat 2 slices, you will have eaten 180 calories. How do I keep a food log? Immediately after each meal, record the following information in your food log:  What you ate. Don't forget to include toppings, sauces, and other extras on the food.  How much you ate. This can be measured in cups, ounces, or number of items.  How many calories each food and drink had.  The total number of calories in the meal. Keep your food log near you, such as in a small notebook in your pocket, or use a mobile app or website. Some programs will calculate calories for you and show you how many calories you have left for the day to meet your goal. What are some calorie counting tips?   Use your calories on foods and drinks that will fill you up and not leave you hungry: ? Some examples of foods that fill you up are nuts and nut butters, vegetables, lean proteins, and high-fiber foods like whole grains. High-fiber foods are foods with more than 5 g fiber per serving. ? Drinks such as sodas, specialty coffee drinks, alcohol, and juices have a lot of calories, yet do not fill you up.  Eat nutritious foods and avoid empty calories. Empty calories are calories you get from foods or beverages that do not have many vitamins or protein, such as candy, sweets, and soda. It is better to have a nutritious high-calorie food (such as an avocado) than a food with few nutrients (such as a bag of chips).  Know how many calories are in the foods you eat most often. This will help you calculate calorie counts faster.  Pay attention to calories in drinks. Low-calorie drinks include water and unsweetened drinks.  Pay attention to nutrition labels for "low fat" or "fat free" foods. These foods sometimes have the same amount of calories or more calories than the full fat versions. They  also often have added sugar, starch, or salt, to make up for flavor that was removed with the fat.  Find a way of tracking calories that works for you. Get creative. Try different apps or programs if writing down calories does not work for you. What are some portion control tips?  Know how many calories are in a serving. This will help you know how many servings of a certain food you can have.  Use a measuring cup to measure serving sizes. You could also try weighing out portions on a kitchen scale. With time, you will  be able to estimate serving sizes for some foods.  Take some time to put servings of different foods on your favorite plates, bowls, and cups so you know what a serving looks like.  Try not to eat straight from a bag or box. Doing this can lead to overeating. Put the amount you would like to eat in a cup or on a plate to make sure you are eating the right portion.  Use smaller plates, glasses, and bowls to prevent overeating.  Try not to multitask (for example, watch TV or use your computer) while eating. If it is time to eat, sit down at a table and enjoy your food. This will help you to know when you are full. It will also help you to be aware of what you are eating and how much you are eating. What are tips for following this plan? Reading food labels  Check the calorie count compared to the serving size. The serving size may be smaller than what you are used to eating.  Check the source of the calories. Make sure the food you are eating is high in vitamins and protein and low in saturated and trans fats. Shopping  Read nutrition labels while you shop. This will help you make healthy decisions before you decide to purchase your food.  Make a grocery list and stick to it. Cooking  Try to cook your favorite foods in a healthier way. For example, try baking instead of frying.  Use low-fat dairy products. Meal planning  Use more fruits and vegetables. Half of your  plate should be fruits and vegetables.  Include lean proteins like poultry and fish. How do I count calories when eating out?  Ask for smaller portion sizes.  Consider sharing an entree and sides instead of getting your own entree.  If you get your own entree, eat only half. Ask for a box at the beginning of your meal and put the rest of your entree in it so you are not tempted to eat it.  If calories are listed on the menu, choose the lower calorie options.  Choose dishes that include vegetables, fruits, whole grains, low-fat dairy products, and lean protein.  Choose items that are boiled, broiled, grilled, or steamed. Stay away from items that are buttered, battered, fried, or served with cream sauce. Items labeled "crispy" are usually fried, unless stated otherwise.  Choose water, low-fat milk, unsweetened iced tea, or other drinks without added sugar. If you want an alcoholic beverage, choose a lower calorie option such as a glass of wine or light beer.  Ask for dressings, sauces, and syrups on the side. These are usually high in calories, so you should limit the amount you eat.  If you want a salad, choose a garden salad and ask for grilled meats. Avoid extra toppings like bacon, cheese, or fried items. Ask for the dressing on the side, or ask for olive oil and vinegar or lemon to use as dressing.  Estimate how many servings of a food you are given. For example, a serving of cooked rice is  cup or about the size of half a baseball. Knowing serving sizes will help you be aware of how much food you are eating at restaurants. The list below tells you how big or small some common portion sizes are based on everyday objects: ? 1 oz--4 stacked dice. ? 3 oz--1 deck of cards. ? 1 tsp--1 die. ? 1 Tbsp-- a ping-pong ball. ? 2 Tbsp--1  ping-pong ball. ?  cup-- baseball. ? 1 cup--1 baseball. Summary  Calorie counting means keeping track of how many calories you eat and drink each day. If  you eat fewer calories than your body needs, you should lose weight.  A healthy amount of weight to lose per week is usually 1-2 lb (0.5-0.9 kg). This usually means reducing your daily calorie intake by 500-750 calories.  The number of calories in a food can be found on a Nutrition Facts label. If a food does not have a Nutrition Facts label, try to look up the calories online or ask your dietitian for help.  Use your calories on foods and drinks that will fill you up, and not on foods and drinks that will leave you hungry.  Use smaller plates, glasses, and bowls to prevent overeating. This information is not intended to replace advice given to you by your health care provider. Make sure you discuss any questions you have with your health care provider. Document Revised: 09/22/2017 Document Reviewed: 12/04/2015 Elsevier Patient Education  2020 [place] Following a healthy eating pattern may help you to achieve and maintain a healthy body weight, reduce the risk of chronic disease, and live a long and productive life. It is important to follow a healthy eating pattern at an appropriate calorie level for your body. Your nutritional needs should be met primarily through food by choosing a variety of nutrient-rich foods. What are tips for following this plan? Reading food labels  Read labels and choose the following: ? Reduced or low sodium. ? Juices with 100% fruit juice. ? Foods with low saturated fats and high polyunsaturated and monounsaturated fats. ? Foods with whole grains, such as whole wheat, cracked wheat, brown rice, and wild rice. ? Whole grains that are fortified with folic acid. This is recommended for women who are pregnant or who want to become pregnant.  Read labels and avoid the following: ? Foods with a lot of added sugars. These include foods that contain brown sugar, corn sweetener, corn syrup, dextrose, fructose, glucose, high-fructose corn syrup,  honey, invert sugar, lactose, malt syrup, maltose, molasses, raw sugar, sucrose, trehalose, or turbinado sugar.  Do not eat more than the following amounts of added sugar per day:  6 teaspoons (25 g) for women.  9 teaspoons (38 g) for men. ? Foods that contain processed or refined starches and grains. ? Refined grain products, such as white flour, degermed cornmeal, white bread, and white rice. Shopping  Choose nutrient-rich snacks, such as vegetables, whole fruits, and nuts. Avoid high-calorie and high-sugar snacks, such as potato chips, fruit snacks, and candy.  Use oil-based dressings and spreads on foods instead of solid fats such as butter, stick margarine, or cream cheese.  Limit pre-made sauces, mixes, and "instant" products such as flavored rice, instant noodles, and ready-made pasta.  Try more plant-protein sources, such as tofu, tempeh, black beans, edamame, lentils, nuts, and seeds.  Explore eating plans such as the Mediterranean diet or vegetarian diet. Cooking  Use oil to saut or stir-fry foods instead of solid fats such as butter, stick margarine, or lard.  Try baking, boiling, grilling, or broiling instead of frying.  Remove the fatty part of meats before cooking.  Steam vegetables in water or broth. Meal planning   At meals, imagine dividing your plate into fourths: ? One-half of your plate is fruits and vegetables. ? One-fourth of your plate is whole grains. ? One-fourth of your plate is  protein, especially lean meats, poultry, eggs, tofu, beans, or nuts.  Include low-fat dairy as part of your daily diet. Lifestyle  Choose healthy options in all settings, including home, work, school, restaurants, or stores.  Prepare your food safely: ? Wash your hands after handling raw meats. ? Keep food preparation surfaces clean by regularly washing with hot, soapy water. ? Keep raw meats separate from ready-to-eat foods, such as fruits and vegetables. ? Cook  seafood, meat, poultry, and eggs to the recommended internal temperature. ? Store foods at safe temperatures. In general:  Keep cold foods at 27F (4.4C) or below.  Keep hot foods at 127F (60C) or above.  Keep your freezer at [facility] (-17.8C) or below.  Foods are no longer safe to eat when they have been between the temperatures of 40-127F (4.4-60C) for more than 2 hours. What foods should I eat? Fruits Aim to eat 2 cup-equivalents of fresh, canned (in natural juice), or frozen fruits each day. Examples of 1 cup-equivalent of fruit include 1 small apple, 8 large strawberries, 1 cup canned fruit,  cup dried fruit, or 1 cup 100% juice. Vegetables Aim to eat 2-3 cup-equivalents of fresh and frozen vegetables each day, including different varieties and colors. Examples of 1 cup-equivalent of vegetables include 2 medium carrots, 2 cups raw, leafy greens, 1 cup chopped vegetable (raw or cooked), or 1 medium baked potato. Grains Aim to eat 6 ounce-equivalents of whole grains each day. Examples of 1 ounce-equivalent of grains include 1 slice of bread, 1 cup ready-to-eat cereal, 3 cups popcorn, or  cup cooked rice, pasta, or cereal. Meats and other proteins Aim to eat 5-6 ounce-equivalents of protein each day. Examples of 1 ounce-equivalent of protein include 1 egg, 1/2 cup nuts or seeds, or 1 tablespoon (16 g) peanut butter. A cut of meat or fish that is the size of a deck of cards is about 3-4 ounce-equivalents.  Of the protein you eat each week, try to have at least 8 ounces come from seafood. This includes salmon, trout, herring, and anchovies. Dairy Aim to eat 3 cup-equivalents of fat-free or low-fat dairy each day. Examples of 1 cup-equivalent of dairy include 1 cup (240 mL) milk, 8 ounces (250 g) yogurt, 1 ounces (44 g) natural cheese, or 1 cup (240 mL) fortified soy milk. Fats and oils  Aim for about 5 teaspoons (21 g) per day. Choose monounsaturated fats, such as canola and olive  oils, avocados, peanut butter, and most nuts, or polyunsaturated fats, such as sunflower, corn, and soybean oils, walnuts, pine nuts, sesame seeds, sunflower seeds, and flaxseed. Beverages  Aim for six 8-oz glasses of water per day. Limit coffee to three to five 8-oz cups per day.  Limit caffeinated beverages that have added calories, such as soda and energy drinks.  Limit alcohol intake to no more than 1 drink a day for nonpregnant women and 2 drinks a day for men. One drink equals 12 oz of beer (355 mL), 5 oz of wine (148 mL), or 1 oz of hard liquor (44 mL). Seasoning and other foods  Avoid adding excess amounts of salt to your foods. Try flavoring foods with herbs and spices instead of salt.  Avoid adding sugar to foods.  Try using oil-based dressings, sauces, and spreads instead of solid fats. This information is based on general [place] nutrition guidelines. For more information, visit Build[employer].es. Exact amounts may vary based on your nutrition needs. Summary  A healthy eating plan may help you to  maintain a healthy weight, reduce the risk of chronic diseases, and stay active throughout your life.  Plan your meals. Make sure you eat the right portions of a variety of nutrient-rich foods.  Try baking, boiling, grilling, or broiling instead of frying.  Choose healthy options in all settings, including home, work, school, restaurants, or stores. This information is not intended to replace advice given to you by your health care provider. Make sure you discuss any questions you have with your health care provider. Document Revised: 04/17/2017 Document Reviewed: 04/17/2017 Elsevier Patient Education  [place].

## 2019-06-13 NOTE — Progress Notes (Signed)
I have seen, interviewed, and examined the patient in conjunction with the [employer] Nurse Practitioner student and affirm the diagnosis and management plan.   Gunnar Bulla, CNM [facility], [facility] 06/13/19 3:26 PM

## 2019-06-13 NOTE — Progress Notes (Signed)
GYN ENCOUNTER NOTE  Subjective:       Amber [name] is a 34 y.o. G39P1001 female is here for weight management visit.   Total weight loss of 26 lbs since March. Reports loss of 12 lbs in the past month.  Doing well, no medication side effects.  Denies difficulty breathing or respiratory distress, chest pain, abdominal pain, excessive vaginal bleeding, dysuria, leg pain or swelling  Gynecologic History  Patient's last menstrual period was 05/23/2019 (approximate).   Contraception: OCP (estrogen/progesterone)   Last Pap: 01/2017. Results were: Neg/Neg  Obstetric History  OB History  Gravida Para Term Preterm AB Living  1 1 1     1   SAB TAB Ectopic Multiple Live Births          1    # Outcome Date GA Lbr Len/2nd Weight Sex Delivery Anes PTL Lv  1 Term 10/02/13   6 lb 11 oz (3.033 kg) F Vag-Spont EPI N LIV    Past Medical History:  Diagnosis Date  . Anemia    iron deficiency  . Arthritis   . Asthma    as a child   . Family history of adverse reaction to anesthesia    mother gets n/v with anesthesia   . GERD (gastroesophageal reflux disease)   . Headache(784.0)   . Heart murmur    as a baby   . History of kidney stones   . Morbid obesity (HCC)   . Palpitations   . Stress fracture    Medial tibial plateau; mild medial retinacular sprain and PF lig sprain   . Thyroid disease     Past Surgical History:  Procedure Laterality Date  . LAPAROSCOPIC CHOLECYSTECTOMY SINGLE SITE WITH INTRAOPERATIVE CHOLANGIOGRAM N/A 07/05/2016   Procedure: LAPAROSCOPIC CHOLECYSTECTOMY  WITH INTRAOPERATIVE CHOLANGIOGRAM;  Surgeon: 07/07/2016, MD;  Location: [facility];  Service: General;  Laterality: N/A;  . tubes in ears     multiple     Current Outpatient Medications on File Prior to Visit  Medication Sig Dispense Refill  . cetirizine (ZYRTEC) 10 MG tablet Take 10 mg by mouth daily.    . diclofenac (VOLTAREN) 50 MG EC tablet Take 50 mg by mouth 2 (two) times daily as needed.    [name]  levonorgestrel-ethinyl estradiol (SEASONALE) 0.15-0.03 MG tablet Take 1 tablet by mouth daily. 1 Package 4  . levothyroxine (SYNTHROID) 50 MCG tablet Take 50 mcg by mouth daily.     . montelukast (SINGULAIR) 10 MG tablet TAKE ONE TABLET AT BEDTIME *NEEDS APPT FOR ADDITIONAL REFILLS AFTER THIS ONE* 30 tablet 0  . Multiple Vitamins-Minerals (MULTIVITAMIN WITH MINERALS) tablet Take 1 tablet by mouth daily.    . pantoprazole (PROTONIX) 40 MG tablet TAKE 1 TABLET BY MOUTH TWICE DAILY BEFORE A MEAL 60 tablet 6  . phentermine 30 MG capsule Take 1 capsule (30 mg total) by mouth every morning. 16 capsule 0  . SUMAtriptan (IMITREX) 50 MG tablet Take 1 tablet (50 mg total) by mouth every 2 (two) hours as needed for migraine. May repeat in 2 hours if headache persists or recurs. 10 tablet 0  . topiramate (TOPAMAX) 25 MG capsule Take 1 capsule (25 mg total) by mouth daily. 30 capsule 0   No current facility-administered medications on file prior to visit.    Allergies  Allergen Reactions  . Dilaudid [Hydromorphone Hcl] Other (See Comments)    Hallucinations  . Latex Rash    Social History   Socioeconomic History  . Marital status:  Married    Spouse name: Not on file  . Number of children: 1  . Years of education: Not on file  . Highest education level: Not on file  Occupational History  . Not on file  Tobacco Use  . Smoking status: Never Smoker  . Smokeless tobacco: Never Used  Substance and Sexual Activity  . Alcohol use: No    Alcohol/week: 0.0 standard drinks  . Drug use: No  . Sexual activity: Yes    Birth control/protection: Pill  Other Topics Concern  . Not on file  Social History Narrative   Married; works as a Education administrator at [employer].    Social Determinants of Health   Financial Resource Strain:   . Difficulty of Paying Living Expenses:   Food Insecurity:   . Worried About Charity fundraiser in the Last Year:   . Arboriculturist in the Last Year:    Transportation Needs:   . Film/video editor (Medical):   Marland Kitchen Lack of Transportation (Non-Medical):   Physical Activity:   . Days of Exercise per Week:   . Minutes of Exercise per Session:   Stress:   . Feeling of Stress :   Social Connections:   . Frequency of Communication with Friends and Family:   . Frequency of Social Gatherings with Friends and Family:   . Attends Religious Services:   . Active Member of Clubs or Organizations:   . Attends Archivist Meetings:   Marland Kitchen Marital Status:   Intimate Partner Violence:   . Fear of Current or Ex-Partner:   . Emotionally Abused:   Marland Kitchen Physically Abused:   . Sexually Abused:     Family History  Problem Relation Age of Onset  . Hypertension Other        family hx   . Colon polyps Father   . Breast cancer Neg Hx   . Ovarian cancer Neg Hx   . Colon cancer Neg Hx     The following portions of the patient's history were reviewed and updated as appropriate: allergies, current medications, past family history, past medical history, past social history, past surgical history and problem list.  Review of Systems  ROS- negative except as noted above. Information obtained from patient.   Objective:   BP 118/81   Pulse 83   Ht 5\' 9"  (1.753 m)   Wt 295 lb (133.8 kg)   LMP 05/23/2019 (Approximate)   BMI 43.56 kg/m    CONSTITUTIONAL: Well-developed, well-nourished female in no acute distress.   WAIST CIRCUMFERENCE: 45 inches.   Assessment:   1. BMI 40.0-44.9, adult ([name])   2. Encounter for weight management   3. Weight loss due to medication    Plan:   B-12 injection given today. See Chart  Praised patient on lifestyle changes.   Rx'd Phertermine. See Orders.  Rx'd Topamax. See Orders.  Reviewed red flag symptoms and when to call the office.   RTC x 2 months for weight management follow up or sooner if needed.   [name] RN [place] 06/13/19 2:49 PM

## 2019-06-21 ENCOUNTER — Encounter: Payer: Medicaid Other | Admitting: Certified Nurse Midwife

## 2019-07-09 ENCOUNTER — Telehealth: Payer: Self-pay | Admitting: Certified Nurse Midwife

## 2019-07-09 NOTE — Telephone Encounter (Signed)
Patient called in wanting to set up an appointment for just a b-12 injection. Patient cancelled her appointment for one that was already previously scheduled for June, went ahead and scheduled patient for injection. Patient stated that her provider informed her that she could come in for a b-12 injection if patient felt like she needed one.

## 2019-07-10 NOTE — Telephone Encounter (Signed)
Aware. Thanks, JML

## 2019-07-12 ENCOUNTER — Ambulatory Visit (INDEPENDENT_AMBULATORY_CARE_PROVIDER_SITE_OTHER): Payer: Medicaid Other | Admitting: Certified Nurse Midwife

## 2019-07-12 ENCOUNTER — Other Ambulatory Visit: Payer: Self-pay

## 2019-07-12 ENCOUNTER — Other Ambulatory Visit: Payer: Self-pay | Admitting: Certified Nurse Midwife

## 2019-07-12 DIAGNOSIS — R634 Abnormal weight loss: Secondary | ICD-10-CM | POA: Diagnosis not present

## 2019-07-12 DIAGNOSIS — Z713 Dietary counseling and surveillance: Secondary | ICD-10-CM

## 2019-07-12 MED ORDER — CYANOCOBALAMIN 1000 MCG/ML IJ SOLN
1000.0000 ug | Freq: Once | INTRAMUSCULAR | Status: AC
  Administered 2019-07-12: 1000 ug via INTRAMUSCULAR

## 2019-07-12 NOTE — Progress Notes (Signed)
Patient comes in today for B12 injection. Injection given in left deltoid. Patient tolerated well.  

## 2019-07-14 NOTE — Progress Notes (Signed)
I have reviewed the record and concur with patient management and plan of care.   Serafina Royals, CNM [facility], [facility] 07/14/19 11:21 AM

## 2019-07-17 NOTE — Telephone Encounter (Signed)
Sent to JML to approve or deny.

## 2019-08-09 ENCOUNTER — Encounter: Payer: Self-pay | Admitting: Certified Nurse Midwife

## 2019-08-09 ENCOUNTER — Ambulatory Visit (INDEPENDENT_AMBULATORY_CARE_PROVIDER_SITE_OTHER): Payer: Medicaid Other | Admitting: Certified Nurse Midwife

## 2019-08-09 ENCOUNTER — Other Ambulatory Visit: Payer: Self-pay

## 2019-08-09 VITALS — BP 113/82 | HR 82 | Ht 69.0 in | Wt 276.6 lb

## 2019-08-09 DIAGNOSIS — Z0130 Encounter for examination of blood pressure without abnormal findings: Secondary | ICD-10-CM | POA: Diagnosis not present

## 2019-08-09 DIAGNOSIS — Z6841 Body Mass Index (BMI) 40.0 and over, adult: Secondary | ICD-10-CM | POA: Diagnosis not present

## 2019-08-09 DIAGNOSIS — Z7689 Persons encountering health services in other specified circumstances: Secondary | ICD-10-CM

## 2019-08-09 MED ORDER — CYANOCOBALAMIN 1000 MCG/ML IJ SOLN
1000.0000 ug | Freq: Once | INTRAMUSCULAR | Status: AC
  Administered 2019-08-09: 1000 ug via INTRAMUSCULAR

## 2019-08-09 NOTE — Patient Instructions (Signed)

## 2019-08-09 NOTE — Progress Notes (Signed)
WC 43"

## 2019-08-11 MED ORDER — TOPIRAMATE 25 MG PO CPSP: 25.0000 mg | ORAL_CAPSULE | Freq: Every day | ORAL | 0 refills | Status: DC

## 2019-08-11 MED ORDER — PHENTERMINE HCL 30 MG PO CAPS: 30.0000 mg | ORAL_CAPSULE | ORAL | 2 refills | Status: DC

## 2019-08-11 MED ORDER — CYANOCOBALAMIN 1000 MCG/ML IJ SOLN: 1000.0000 ug | INTRAMUSCULAR | 2 refills | Status: DC

## 2019-08-11 NOTE — Progress Notes (Signed)
GYN ENCOUNTER NOTE  Subjective:       Amber [name] is a 34 y.o. G55P1001 female here for weight management visit.   Taking phentermine and topamax daily with 19 pound weight loss since last visit. No negative side effects from medication.   Denies difficulty breathing or respiratory distress, chest pain, abdominal pain, excessive vaginal bleeding, dysuria, and leg pain or swelling.    Gynecologic History  No LMP recorded. (Menstrual status: Oral contraceptives).  Contraception: OCP (estrogen/progesterone)  Last Pap: 01/2017. Results were: Neg/Neg  Obstetric History  OB History  Gravida Para Term Preterm AB Living  1 1 1     1   SAB TAB Ectopic Multiple Live Births          1    # Outcome Date GA Lbr Len/2nd Weight Sex Delivery Anes PTL Lv  1 Term 10/02/13   6 lb 11 oz (3.033 kg) F Vag-Spont EPI N LIV    Past Medical History:  Diagnosis Date  . Anemia    iron deficiency  . Arthritis   . Asthma    as a child   . Family history of adverse reaction to anesthesia    mother gets n/v with anesthesia   . GERD (gastroesophageal reflux disease)   . Headache(784.0)   . Heart murmur    as a baby   . History of kidney stones   . Morbid obesity (HCC)   . Palpitations   . Stress fracture    Medial tibial plateau; mild medial retinacular sprain and PF lig sprain   . Thyroid disease     Past Surgical History:  Procedure Laterality Date  . LAPAROSCOPIC CHOLECYSTECTOMY SINGLE SITE WITH INTRAOPERATIVE CHOLANGIOGRAM N/A 07/05/2016   Procedure: LAPAROSCOPIC CHOLECYSTECTOMY  WITH INTRAOPERATIVE CHOLANGIOGRAM;  Surgeon: 07/07/2016, MD;  Location: [facility];  Service: General;  Laterality: N/A;  . tubes in ears     multiple     Current Outpatient Medications on File Prior to Visit  Medication Sig Dispense Refill  . cetirizine (ZYRTEC) 10 MG tablet Take 10 mg by mouth daily.    . cyclobenzaprine (FLEXERIL) 5 MG tablet Take 5-10 mg by mouth 2 (two) times daily as needed.    .  diclofenac (VOLTAREN) 50 MG EC tablet Take 50 mg by mouth 2 (two) times daily as needed.    [name] levonorgestrel-ethinyl estradiol (SEASONALE) 0.15-0.03 MG tablet Take 1 tablet by mouth daily. 1 Package 4  . levothyroxine (SYNTHROID) 50 MCG tablet Take 50 mcg by mouth daily.     . montelukast (SINGULAIR) 10 MG tablet TAKE ONE TABLET AT BEDTIME *NEEDS APPT FOR ADDITIONAL REFILLS AFTER THIS ONE* 30 tablet 0  . Multiple Vitamins-Minerals (MULTIVITAMIN WITH MINERALS) tablet Take 1 tablet by mouth daily.    . pantoprazole (PROTONIX) 40 MG tablet TAKE 1 TABLET BY MOUTH TWICE DAILY BEFORE A MEAL 60 tablet 6  . phentermine 30 MG capsule TAKE 1 CAPSULE BY MOUTH EACH MORNING 30 capsule 0  . SUMAtriptan (IMITREX) 50 MG tablet Take 1 tablet (50 mg total) by mouth every 2 (two) hours as needed for migraine. May repeat in 2 hours if headache persists or recurs. 10 tablet 0  . topiramate (TOPAMAX) 25 MG capsule Take 1 capsule (25 mg total) by mouth daily. 60 capsule 0   No current facility-administered medications on file prior to visit.    Allergies  Allergen Reactions  . Dilaudid [Hydromorphone Hcl] Other (See Comments)    Hallucinations  . Latex Rash  Social History   Socioeconomic History  . Marital status: Married    Spouse name: Not on file  . Number of children: 1  . Years of education: Not on file  . Highest education level: Not on file  Occupational History  . Not on file  Tobacco Use  . Smoking status: Never Smoker  . Smokeless tobacco: Never Used  Vaping Use  . Vaping Use: Never used  Substance and Sexual Activity  . Alcohol use: No    Alcohol/week: 0.0 standard drinks  . Drug use: No  . Sexual activity: Yes    Birth control/protection: Pill  Other Topics Concern  . Not on file  Social History Narrative   Married; works as a Pharmacologist at [employer], [employer].    Social Determinants of Health   Financial Resource Strain:   . Difficulty of Paying Living Expenses:   Food  Insecurity:   . Worried About Programme researcher, broadcasting/film/video in the Last Year:   . Barista in the Last Year:   Transportation Needs:   . Freight forwarder (Medical):   Marland Kitchen Lack of Transportation (Non-Medical):   Physical Activity:   . Days of Exercise per Week:   . Minutes of Exercise per Session:   Stress:   . Feeling of Stress :   Social Connections:   . Frequency of Communication with Friends and Family:   . Frequency of Social Gatherings with Friends and Family:   . Attends Religious Services:   . Active Member of Clubs or Organizations:   . Attends Banker Meetings:   Marland Kitchen Marital Status:   Intimate Partner Violence:   . Fear of Current or Ex-Partner:   . Emotionally Abused:   Marland Kitchen Physically Abused:   . Sexually Abused:     Family History  Problem Relation Age of Onset  . Hypertension Other        family hx   . Colon polyps Father   . Breast cancer Neg Hx   . Ovarian cancer Neg Hx   . Colon cancer Neg Hx     The following portions of the patient's history were reviewed and updated as appropriate: allergies, current medications, past family history, past medical history, past social history, past surgical history and problem list.  Review of Systems  ROS negative except as noted above. Information obtained from patient.   Objective:   BP 113/82   Pulse 82   Ht 5\' 9"  (1.753 m)   Wt (!) 276 lb 9 oz (125.4 kg)   BMI 40.84 kg/m    CONSTITUTIONAL: Well-developed, well-nourished female in no acute distress.   ABDOMEN: Waist circumference: 43 inches.   Assessment:   1. Encounter for weight management   2. BMI 40.0-44.9, adult (HCC)   3. Blood pressure check   Plan:   B-12 injection today, see chart.   Praise given for lifestyle modifications.   Rx Phentermine, B-12,  and Topamax, see orders.   Reviewed red flag symptoms and when to call.   RTC x 3 months for weight management visit or sooner if needed.    10-30-1982,  CNM [facility], [facility]

## 2019-10-10 ENCOUNTER — Other Ambulatory Visit: Payer: Self-pay | Admitting: Certified Nurse Midwife

## 2019-11-11 ENCOUNTER — Encounter: Payer: Medicaid Other | Admitting: Certified Nurse Midwife

## 2019-11-28 ENCOUNTER — Ambulatory Visit (INDEPENDENT_AMBULATORY_CARE_PROVIDER_SITE_OTHER): Payer: Medicaid Other | Admitting: Certified Nurse Midwife

## 2019-11-28 ENCOUNTER — Other Ambulatory Visit: Payer: Self-pay

## 2019-11-28 ENCOUNTER — Encounter: Payer: Self-pay | Admitting: Certified Nurse Midwife

## 2019-11-28 VITALS — BP 100/70 | HR 87 | Ht 69.0 in | Wt 247.1 lb

## 2019-11-28 DIAGNOSIS — Z0130 Encounter for examination of blood pressure without abnormal findings: Secondary | ICD-10-CM | POA: Diagnosis not present

## 2019-11-28 DIAGNOSIS — Z6836 Body mass index (BMI) 36.0-36.9, adult: Secondary | ICD-10-CM

## 2019-11-28 DIAGNOSIS — Z7689 Persons encountering health services in other specified circumstances: Secondary | ICD-10-CM

## 2019-11-28 MED ORDER — CYANOCOBALAMIN 1000 MCG/ML IJ SOLN: 1000.0000 ug | INTRAMUSCULAR | 2 refills | Status: DC

## 2019-11-28 MED ORDER — PHENTERMINE HCL 30 MG PO CAPS: 30.0000 mg | ORAL_CAPSULE | ORAL | 2 refills | Status: DC

## 2019-11-28 MED ORDER — TOPIRAMATE 25 MG PO CPSP: 25.0000 mg | ORAL_CAPSULE | Freq: Every day | ORAL | 0 refills | Status: DC

## 2019-11-28 NOTE — Progress Notes (Signed)
GYN ENCOUNTER NOTE  Subjective:       Amber [name] is a 34 y.o. G35P1001 female here weight management visit.   Last seen in office on 08/09/2019 for initiation of weight management program with starting weight 276 lbs 9 oz and BMI 40.8.   Doing well on medications without negative side effects.   Denies difficulty breathing or respiratory distress, chest pain, abdominal pain, excessive vaginal bleeding, dysuria, and leg pain or swelling.    Gynecologic History  Patient's last menstrual period was 11/06/2019.  Contraception: OCP (estrogen/progesterone)  Last Pap: 01/2017. Results were: Neg/Neg  Obstetric History  OB History  Gravida Para Term Preterm AB Living  1 1 1     1   SAB TAB Ectopic Multiple Live Births          1    # Outcome Date GA Lbr Len/2nd Weight Sex Delivery Anes PTL Lv  1 Term 10/02/13   6 lb 11 oz (3.033 kg) F Vag-Spont EPI N LIV    Past Medical History:  Diagnosis Date  . Anemia    iron deficiency  . Arthritis   . Asthma    as a child   . Family history of adverse reaction to anesthesia    mother gets n/v with anesthesia   . GERD (gastroesophageal reflux disease)   . Headache(784.0)   . Heart murmur    as a baby   . History of kidney stones   . Morbid obesity (HCC)   . Palpitations   . Stress fracture    Medial tibial plateau; mild medial retinacular sprain and PF lig sprain   . Thyroid disease     Past Surgical History:  Procedure Laterality Date  . LAPAROSCOPIC CHOLECYSTECTOMY SINGLE SITE WITH INTRAOPERATIVE CHOLANGIOGRAM N/A 07/05/2016   Procedure: LAPAROSCOPIC CHOLECYSTECTOMY  WITH INTRAOPERATIVE CHOLANGIOGRAM;  Surgeon: 07/07/2016, MD;  Location: [facility];  Service: General;  Laterality: N/A;  . tubes in ears     multiple     Current Outpatient Medications on File Prior to Visit  Medication Sig Dispense Refill  . cetirizine (ZYRTEC) 10 MG tablet Take 10 mg by mouth daily.    . cyanocobalamin (,VITAMIN B-12,) 1000 MCG/ML  injection Inject 1 mL (1,000 mcg total) into the muscle every 30 (thirty) days. 1 mL 2  . cyclobenzaprine (FLEXERIL) 5 MG tablet Take 5-10 mg by mouth 2 (two) times daily as needed.    . diclofenac (VOLTAREN) 50 MG EC tablet Take 50 mg by mouth 2 (two) times daily as needed.    [name] levonorgestrel-ethinyl estradiol (SEASONALE) 0.15-0.03 MG tablet Take 1 tablet by mouth daily. 1 Package 4  . levothyroxine (SYNTHROID) 50 MCG tablet Take by mouth.    . montelukast (SINGULAIR) 10 MG tablet TAKE ONE TABLET AT BEDTIME *NEEDS APPT FOR ADDITIONAL REFILLS AFTER THIS ONE* 30 tablet 0  . Multiple Vitamins-Minerals (MULTIVITAMIN WITH MINERALS) tablet Take 1 tablet by mouth daily.    . pantoprazole (PROTONIX) 40 MG tablet TAKE 1 TABLET BY MOUTH TWICE DAILY BEFORE A MEAL 60 tablet 5  . phentermine 30 MG capsule Take 1 capsule (30 mg total) by mouth every morning. 30 capsule 2  . SUMAtriptan (IMITREX) 50 MG tablet Take 1 tablet (50 mg total) by mouth every 2 (two) hours as needed for migraine. May repeat in 2 hours if headache persists or recurs. 10 tablet 0  . topiramate (TOPAMAX) 25 MG capsule Take 1 capsule (25 mg total) by mouth daily. 90 capsule 0  .  levothyroxine (SYNTHROID) 50 MCG tablet Take 50 mcg by mouth daily.      No current facility-administered medications on file prior to visit.    Allergies  Allergen Reactions  . Dilaudid [Hydromorphone Hcl] Other (See Comments)    Hallucinations  . Latex Rash    Social History   Socioeconomic History  . Marital status: Married    Spouse name: Not on file  . Number of children: 1  . Years of education: Not on file  . Highest education level: Not on file  Occupational History  . Not on file  Tobacco Use  . Smoking status: Never Smoker  . Smokeless tobacco: Never Used  Vaping Use  . Vaping Use: Never used  Substance and Sexual Activity  . Alcohol use: No    Alcohol/week: 0.0 standard drinks  . Drug use: No  . Sexual activity: Yes    Birth  control/protection: Pill  Other Topics Concern  . Not on file  Social History Narrative   Married; works as a Pharmacologist at [employer], [employer].    Social Determinants of Health   Financial Resource Strain:   . Difficulty of Paying Living Expenses: Not on file  Food Insecurity:   . Worried About Programme researcher, broadcasting/film/video in the Last Year: Not on file  . Ran Out of Food in the Last Year: Not on file  Transportation Needs:   . Lack of Transportation (Medical): Not on file  . Lack of Transportation (Non-Medical): Not on file  Physical Activity:   . Days of Exercise per Week: Not on file  . Minutes of Exercise per Session: Not on file  Stress:   . Feeling of Stress : Not on file  Social Connections:   . Frequency of Communication with Friends and Family: Not on file  . Frequency of Social Gatherings with Friends and Family: Not on file  . Attends Religious Services: Not on file  . Active Member of Clubs or Organizations: Not on file  . Attends Banker Meetings: Not on file  . Marital Status: Not on file  Intimate Partner Violence:   . Fear of Current or Ex-Partner: Not on file  . Emotionally Abused: Not on file  . Physically Abused: Not on file  . Sexually Abused: Not on file    Family History  Problem Relation Age of Onset  . Hypertension Other        family hx   . Colon polyps Father   . Breast cancer Neg Hx   . Ovarian cancer Neg Hx   . Colon cancer Neg Hx     The following portions of the patient's history were reviewed and updated as appropriate: allergies, current medications, past family history, past medical history, past social history, past surgical history and problem list.  Review of Systems  ROS negative except as noted above. Information obtained from patient.   Objective:   BP 100/70   Pulse 87   Ht 5\' 9"  (1.753 m)   Wt 247 lb 1.6 oz (112.1 kg)   LMP 11/06/2019   BMI 36.49 kg/m    CONSTITUTIONAL: Well-developed, well-nourished female  in no acute distress.   ABDOMEN: 39 inches.  Assessment:   1. Encounter for weight management  2. Blood pressure check  3. BMI 36.0-36.9,adult  Plan:   Praise given for weight loss. Wishes to continue medication at this time.   Rx Phentermine, Topamax, and B-12 injection; see orders.   Reviewed red flag symptoms  and when to call.   RTC as previously scheduled or sooner if needed.    Serafina Royals, CNM [facility], [facility] 11/28/19 6:25 PM

## 2019-11-28 NOTE — Patient Instructions (Signed)
Calorie Counting for Weight Loss Calories are units of energy. Your body needs a certain amount of calories from food to keep you going throughout the day. When you eat more calories than your body needs, your body stores the extra calories as fat. When you eat fewer calories than your body needs, your body burns fat to get the energy it needs. Calorie counting means keeping track of how many calories you eat and drink each day. Calorie counting can be helpful if you need to lose weight. If you make sure to eat fewer calories than your body needs, you should lose weight. Ask your health care provider what a healthy weight is for you. For calorie counting to work, you will need to eat the right number of calories in a day in order to lose a healthy amount of weight per week. A dietitian can help you determine how many calories you need in a day and will give you suggestions on how to reach your calorie goal.  A healthy amount of weight to lose per week is usually 1-2 lb (0.5-0.9 kg). This usually means that your daily calorie intake should be reduced by 500-750 calories.  Eating 1,200 - 1,500 calories per day can help most women lose weight.  Eating 1,500 - 1,800 calories per day can help most men lose weight. What is my plan? My goal is to have __________ calories per day. If I have this many calories per day, I should lose around __________ pounds per week. What do I need to know about calorie counting? In order to meet your daily calorie goal, you will need to:  Find out how many calories are in each food you would like to eat. Try to do this before you eat.  Decide how much of the food you plan to eat.  Write down what you ate and how many calories it had. Doing this is called keeping a food log. To successfully lose weight, it is important to balance calorie counting with a healthy lifestyle that includes regular activity. Aim for 150 minutes of moderate exercise (such as walking) or 75  minutes of vigorous exercise (such as running) each week. Where do I find calorie information?  The number of calories in a food can be found on a Nutrition Facts label. If a food does not have a Nutrition Facts label, try to look up the calories online or ask your dietitian for help. Remember that calories are listed per serving. If you choose to have more than one serving of a food, you will have to multiply the calories per serving by the amount of servings you plan to eat. For example, the label on a package of bread might say that a serving size is 1 slice and that there are 90 calories in a serving. If you eat 1 slice, you will have eaten 90 calories. If you eat 2 slices, you will have eaten 180 calories. How do I keep a food log? Immediately after each meal, record the following information in your food log:  What you ate. Don't forget to include toppings, sauces, and other extras on the food.  How much you ate. This can be measured in cups, ounces, or number of items.  How many calories each food and drink had.  The total number of calories in the meal. Keep your food log near you, such as in a small notebook in your pocket, or use a mobile app or website. Some programs will calculate  calories for you and show you how many calories you have left for the day to meet your goal. °What are some calorie counting tips? ° °· Use your calories on foods and drinks that will fill you up and not leave you hungry: °? Some examples of foods that fill you up are nuts and nut butters, vegetables, lean proteins, and high-fiber foods like whole grains. High-fiber foods are foods with more than 5 g fiber per serving. °? Drinks such as sodas, specialty coffee drinks, alcohol, and juices have a lot of calories, yet do not fill you up. °· Eat nutritious foods and avoid empty calories. Empty calories are calories you get from foods or beverages that do not have many vitamins or protein, such as candy, sweets, and  soda. It is better to have a nutritious high-calorie food (such as an avocado) than a food with few nutrients (such as a bag of chips). °· Know how many calories are in the foods you eat most often. This will help you calculate calorie counts faster. °· Pay attention to calories in drinks. Low-calorie drinks include water and unsweetened drinks. °· Pay attention to nutrition labels for "low fat" or "fat free" foods. These foods sometimes have the same amount of calories or more calories than the full fat versions. They also often have added sugar, starch, or salt, to make up for flavor that was removed with the fat. °· Find a way of tracking calories that works for you. Get creative. Try different apps or programs if writing down calories does not work for you. °What are some portion control tips? °· Know how many calories are in a serving. This will help you know how many servings of a certain food you can have. °· Use a measuring cup to measure serving sizes. You could also try weighing out portions on a kitchen scale. With time, you will be able to estimate serving sizes for some foods. °· Take some time to put servings of different foods on your favorite plates, bowls, and cups so you know what a serving looks like. °· Try not to eat straight from a bag or box. Doing this can lead to overeating. Put the amount you would like to eat in a cup or on a plate to make sure you are eating the right portion. °· Use smaller plates, glasses, and bowls to prevent overeating. °· Try not to multitask (for example, watch TV or use your computer) while eating. If it is time to eat, sit down at a table and enjoy your food. This will help you to know when you are full. It will also help you to be aware of what you are eating and how much you are eating. °What are tips for following this plan? °Reading food labels °· Check the calorie count compared to the serving size. The serving size may be smaller than what you are used to  eating. °· Check the source of the calories. Make sure the food you are eating is high in vitamins and protein and low in saturated and trans fats. °Shopping °· Read nutrition labels while you shop. This will help you make healthy decisions before you decide to purchase your food. °· Make a grocery list and stick to it. °Cooking °· Try to cook your favorite foods in a healthier way. For example, try baking instead of frying. °· Use low-fat dairy products. °Meal planning °· Use more fruits and vegetables. Half of your plate should be fruits   and vegetables.  Include lean proteins like poultry and fish. How do I count calories when eating out?  Ask for smaller portion sizes.  Consider sharing an entree and sides instead of getting your own entree.  If you get your own entree, eat only half. Ask for a box at the beginning of your meal and put the rest of your entree in it so you are not tempted to eat it.  If calories are listed on the menu, choose the lower calorie options.  Choose dishes that include vegetables, fruits, whole grains, low-fat dairy products, and lean protein.  Choose items that are boiled, broiled, grilled, or steamed. Stay away from items that are buttered, battered, fried, or served with cream sauce. Items labeled "crispy" are usually fried, unless stated otherwise.  Choose water, low-fat milk, unsweetened iced tea, or other drinks without added sugar. If you want an alcoholic beverage, choose a lower calorie option such as a glass of wine or light beer.  Ask for dressings, sauces, and syrups on the side. These are usually high in calories, so you should limit the amount you eat.  If you want a salad, choose a garden salad and ask for grilled meats. Avoid extra toppings like bacon, cheese, or fried items. Ask for the dressing on the side, or ask for olive oil and vinegar or lemon to use as dressing.  Estimate how many servings of a food you are given. For example, a serving of  cooked rice is  cup or about the size of half a baseball. Knowing serving sizes will help you be aware of how much food you are eating at restaurants. The list below tells you how big or small some common portion sizes are based on everyday objects: ? 1 oz--4 stacked dice. ? 3 oz--1 deck of cards. ? 1 tsp--1 die. ? 1 Tbsp-- a ping-pong ball. ? 2 Tbsp--1 ping-pong ball. ?  cup-- baseball. ? 1 cup--1 baseball. Summary  Calorie counting means keeping track of how many calories you eat and drink each day. If you eat fewer calories than your body needs, you should lose weight.  A healthy amount of weight to lose per week is usually 1-2 lb (0.5-0.9 kg). This usually means reducing your daily calorie intake by 500-750 calories.  The number of calories in a food can be found on a Nutrition Facts label. If a food does not have a Nutrition Facts label, try to look up the calories online or ask your dietitian for help.  Use your calories on foods and drinks that will fill you up, and not on foods and drinks that will leave you hungry.  Use smaller plates, glasses, and bowls to prevent overeating. This information is not intended to replace advice given to you by your health care provider. Make sure you discuss any questions you have with your health care provider. Document Revised: 09/22/2017 Document Reviewed: 12/04/2015 Elsevier Patient Education  2020 [employer].   Phentermine; Topiramate extended-release capsules What is this medicine? Phentermine; Topiramate (FEN ter meen; Toe PYRE a mate) is a combination of two drugs that decrease appetite and help you lose weight. This product is used with a reduced calorie diet and exercise. This product can also help you maintain weight loss. This medicine may be used for other purposes; ask your health care provider or pharmacist if you have questions. COMMON BRAND NAME(S): Qsymia What should I tell my health care provider before I take this  medicine? They need  to know if you have any of these conditions:  bone problems  depression or other mental illness  diabetes  diarrhea  glaucoma  having surgery  heart disease  high blood pressure  history of drug abuse or alcohol abuse problem  history of heart attack or stroke  history of irregular heartbeat  kidney disease or stones  liver disease  low levels of potassium in the blood  lung or breathing disease, like asthma  metabolic acidosis  on a ketogenic diet  seizures  suicidal thoughts, plans, or attempt; a previous suicide attempt by you or a family member  taken an MAOI like Carbex, Eldepryl, Marplan, Nardil, or Parnate in last 14 days  thyroid disease  an unusual or allergic reaction to phentermine, topiramate, other medicines, foods, dyes, or preservatives  pregnant or trying to get pregnant  breast-feeding How should I use this medicine? Take this medicine by mouth with a glass of water. Follow the directions on the prescription label. Do not crush or chew. This medicine is usually taken with or without food once per day in the morning. Avoid taking this medicine in the evening. It may interfere with sleep. Take your doses at regular intervals. Do not take your medicine more often than directed. A special MedGuide will be given to you by the pharmacist with each prescription and refill. Be sure to read this information carefully each time. Talk to your pediatrician regarding the use of this medicine in children. Special care may be needed. Overdosage: If you think you have taken too much of this medicine contact a poison control center or emergency room at once. NOTE: This medicine is only for you. Do not share this medicine with others. What if I miss a dose? If you miss a dose, skip it. Take your next dose at the normal time. Do not take extra or 2 doses at the same time to make up for the missed dose. What may interact with this  medicine? Do not take this medicine with any of the following medications:  MAOIs like Carbex, Eldepryl, Marplan, Nardil, and Parnate This medicine may also interact with the following medications:  acetazolamide  alcohol  antihistamines for allergy, cough and cold  atropine  birth control pills  carbamazepine  certain medicines for bladder problems like oxybutynin, tolterodine  certain medicines for depression, anxiety, or psychotic disturbances  certain medicines for high blood pressure  certain medicines for Parkinson's disease like benztropine, trihexyphenidyl  certain medicines for sleep  certain medicines for stomach problems like dicyclomine, hyoscyamine  certain medicines for travel sickness like scopolamine  dichlorphenamide  digoxin  diuretics  linezolid  medicines for colds or breathing difficulties like pseudoephedrine or phenylephrine  medicines for diabetes  methazolamide  narcotic medicines for pain  phenytoin  sibutramine  stimulant medicines for attention disorders, weight loss, or to stay awake  valproic acid  zonisamide This list may not describe all possible interactions. Give your health care provider a list of all the medicines, herbs, non-prescription drugs, or dietary supplements you use. Also tell them if you smoke, drink alcohol, or use illegal drugs. Some items may interact with your medicine. What should I watch for while using this medicine? Visit your doctor or health care provider for regular checks on your progress. Do not stop taking except on your health care provider's advice. You may develop a severe reaction. Your health care provider will tell you how much medicine to take. Do not take this medicine close to bedtime.  It may prevent you from sleeping. Avoid extreme heat. This medicine can cause you to sweat less than normal. Your body temperature could increase to dangerous levels, which may lead to heat stroke. You  should drink plenty of fluids while taking this medicine. If you have had kidney stones in the past, this will help to reduce your chances of forming kidney stones. You may get drowsy or dizzy. Do not drive, use machinery, or do anything that needs mental alertness until you know how this medicine affects you. Do not stand or sit up quickly, especially if you are an older patient. This reduces the risk of dizzy or fainting spells. Alcohol may increase dizziness and drowsiness. Avoid alcoholic drinks. This medicine may affect blood sugar levels. Ask your healthcare provider if changes in diet or medicines are needed if you have diabetes. Check with your health care professional if you have severe diarrhea, nausea, and vomiting, or if you sweat a lot. The loss of too much body fluid may make it dangerous for you to take this medicine. Tell your health care professional right away if you have any change in your eyesight. Patients and their families should watch out for new or worsening depression or thoughts of suicide. Also watch out for sudden changes in feelings such as feeling anxious, agitated, panicky, irritable, hostile, aggressive, impulsive, severely restless, overly excited and hyperactive, or not being able to sleep. If this happens, especially at the beginning of treatment or after a change in dose, call your healthcare professional. This medicine may cause serious skin reactions. They can happen weeks to months after starting the medicine. Contact your health care provider right away if you notice fevers or flu-like symptoms with a rash. The rash may be red or purple and then turn into blisters or peeling of the skin. Or, you might notice a red rash with swelling of the face, lips or lymph nodes in your neck or under your arms. Birth control may not work properly while you are taking this medicine. Talk to your health care professional about using an extra method of birth control. Women should  inform their health care provider if they wish to become pregnant or think they might be pregnant. There is a potential for serious side effects and harm to an unborn child. Losing weight while pregnant is not advised and may cause harm to the unborn child. Talk to your health care provider for more information. What side effects may I notice from receiving this medicine? Side effects that you should report to your doctor or health care professional as soon as possible:  allergic reactions like skin rash, itching or hives, swelling of the face, lips, or tongue  anxious  breathing problems  changes in vision  chest pain or chest tightness  depressed mood or other mood changes  fast, irregular heartbeat  increased blood pressure  irritable  pain, tingling, numbness in the hands or feet  redness, blistering, peeling or loosening of the skin, including inside the mouth  restlessness  seizures  signs and symptoms of increased acid in the body like breathing fast; fast heartbeat; headache; confusion; unusually weak or tired; nausea, vomiting  signs and symptoms of kidney stones like blood in urine; pain in the lower back or side; pain when urinating  signs and symptoms of a stroke like changes in vision; confusion; trouble speaking or understanding; severe headaches; sudden numbness or weakness of the face, arm or leg; trouble walking; dizziness; loss of balance or  coordination  suicidal thoughts  trouble passing urine or change in the amount of urine Side effects that usually do not require medical attention (report to your doctor or health care professional if they continue or are bothersome):  changes in taste  constipation  dizziness  dry mouth  headache  trouble sleeping This list may not describe all possible side effects. Call your doctor for medical advice about side effects. You may report side effects to FDA at 1-800-FDA-1088. Where should I keep my  medicine? Keep out of the reach of children. This medicine can be abused. Keep your medicine in a safe place to protect it from theft. Do not share this medicine with anyone. Selling or giving away this medicine is dangerous and against the law. This medicine may cause harm and death if it is taken by other adults, children, or pets. Return medicine that has not been used to an official disposal site. Contact the DEA at [phone] or your city/county government to find a site. If you cannot return the medicine, mix any unused medicine with a substance like cat litter or coffee grounds. Then throw the medicine away in a sealed container like a sealed bag or coffee can with a lid. Do not use the medicine after the expiration date. Store at room temperature between 15 and 25 degrees C (59 and 77 degrees F). NOTE: This sheet is a summary. It may not cover all possible information. If you have questions about this medicine, talk to your doctor, pharmacist, or health care provider.  2020 Elsevier/Gold Standard (2018-11-09 12:44:21)

## 2019-11-28 NOTE — Progress Notes (Signed)
Wt: 247.1 lb  Wc: 39 in.

## 2020-04-02 ENCOUNTER — Other Ambulatory Visit: Payer: Self-pay | Admitting: Certified Nurse Midwife

## 2020-04-03 ENCOUNTER — Encounter: Payer: Self-pay | Admitting: Certified Nurse Midwife

## 2020-04-03 ENCOUNTER — Ambulatory Visit (INDEPENDENT_AMBULATORY_CARE_PROVIDER_SITE_OTHER): Payer: Medicaid Other | Admitting: Certified Nurse Midwife

## 2020-04-03 ENCOUNTER — Encounter: Payer: Medicaid Other | Admitting: Certified Nurse Midwife

## 2020-04-03 ENCOUNTER — Other Ambulatory Visit: Payer: Self-pay

## 2020-04-03 VITALS — BP 111/73 | HR 79 | Ht 69.0 in | Wt 227.3 lb

## 2020-04-03 DIAGNOSIS — Z0000 Encounter for general adult medical examination without abnormal findings: Secondary | ICD-10-CM | POA: Diagnosis not present

## 2020-04-03 DIAGNOSIS — Z01419 Encounter for gynecological examination (general) (routine) without abnormal findings: Secondary | ICD-10-CM

## 2020-04-03 DIAGNOSIS — Z3041 Encounter for surveillance of contraceptive pills: Secondary | ICD-10-CM | POA: Diagnosis not present

## 2020-04-03 DIAGNOSIS — E7800 Pure hypercholesterolemia, unspecified: Secondary | ICD-10-CM

## 2020-04-03 DIAGNOSIS — Z6833 Body mass index (BMI) 33.0-33.9, adult: Secondary | ICD-10-CM | POA: Diagnosis not present

## 2020-04-03 MED ORDER — LEVONORGEST-ETH ESTRAD 91-DAY 0.15-0.03 MG PO TABS: 1.0000 | ORAL_TABLET | Freq: Every day | ORAL | 4 refills | Status: DC

## 2020-04-03 NOTE — Progress Notes (Signed)
ANNUAL PREVENTATIVE CARE GYN  ENCOUNTER NOTE  Subjective:       Amber [name] is a 35 y.o. G21P1001 female here for a routine annual gynecologic exam.  Current complaints: 1. Needs OCP refill 2. Requests ANNUAL Labs-history high cholesterol last year  Denies difficulty breathing or respiratory distress, chest pain, abdominal pain, excessive vaginal bleeding, dysuria and leg pain or swelling.    Gynecologic History  Patient's last menstrual period was 02/18/2020 (approximate).  Contraception: OCP (estrogen/progesterone)  Last Pap: 2019. Results were: Neg/Neg  Obstetric History  OB History  Gravida Para Term Preterm AB Living  1 1 1     1   SAB IAB Ectopic Multiple Live Births          1    # Outcome Date GA Lbr Len/2nd Weight Sex Delivery Anes PTL Lv  1 Term 10/02/13   6 lb 11 oz (3.033 kg) F Vag-Spont EPI N LIV    Past Medical History:  Diagnosis Date  . Anemia    iron deficiency  . Arthritis   . Asthma    as a child   . Family history of adverse reaction to anesthesia    mother gets n/v with anesthesia   . GERD (gastroesophageal reflux disease)   . Headache(784.0)   . Heart murmur    as a baby   . History of kidney stones   . Morbid obesity (HCC)   . Palpitations   . Stress fracture    Medial tibial plateau; mild medial retinacular sprain and PF lig sprain   . Thyroid disease     Past Surgical History:  Procedure Laterality Date  . LAPAROSCOPIC CHOLECYSTECTOMY SINGLE SITE WITH INTRAOPERATIVE CHOLANGIOGRAM N/A 07/05/2016   Procedure: LAPAROSCOPIC CHOLECYSTECTOMY  WITH INTRAOPERATIVE CHOLANGIOGRAM;  Surgeon: 07/07/2016, MD;  Location: [facility];  Service: General;  Laterality: N/A;  . tubes in ears     multiple     Current Outpatient Medications on File Prior to Visit  Medication Sig Dispense Refill  . cetirizine (ZYRTEC) 10 MG tablet Take 10 mg by mouth daily.    . cyanocobalamin (,VITAMIN B-12,) 1000 MCG/ML injection Inject 1 mL (1,000 mcg total) into  the muscle every 30 (thirty) days. 1 mL 2  . cyclobenzaprine (FLEXERIL) 5 MG tablet Take 5-10 mg by mouth 2 (two) times daily as needed.    . diclofenac (VOLTAREN) 50 MG EC tablet Take 50 mg by mouth 2 (two) times daily as needed.    [name] levonorgestrel-ethinyl estradiol (SEASONALE) 0.15-0.03 MG tablet Take 1 tablet by mouth daily. 1 Package 4  . levothyroxine (SYNTHROID) 50 MCG tablet Take by mouth.    . montelukast (SINGULAIR) 10 MG tablet TAKE ONE TABLET AT BEDTIME *NEEDS APPT FOR ADDITIONAL REFILLS AFTER THIS ONE* 30 tablet 0  . Multiple Vitamins-Minerals (MULTIVITAMIN WITH MINERALS) tablet Take 1 tablet by mouth daily.    . pantoprazole (PROTONIX) 40 MG tablet TAKE 1 TABLET BY MOUTH TWICE DAILY BEFORE A MEAL 60 tablet 5  . phentermine 30 MG capsule Take 1 capsule (30 mg total) by mouth every morning. 30 capsule 2  . SUMAtriptan (IMITREX) 50 MG tablet Take 1 tablet (50 mg total) by mouth every 2 (two) hours as needed for migraine. May repeat in 2 hours if headache persists or recurs. 10 tablet 0  . topiramate (TOPAMAX) 25 MG capsule Take 1 capsule (25 mg total) by mouth daily. 90 capsule 0  . levothyroxine (SYNTHROID) 50 MCG tablet Take 50 mcg by mouth  daily.      No current facility-administered medications on file prior to visit.    Allergies  Allergen Reactions  . Dilaudid [Hydromorphone Hcl] Other (See Comments)    Hallucinations  . Latex Rash    Social History   Socioeconomic History  . Marital status: Married    Spouse name: Not on file  . Number of children: 1  . Years of education: Not on file  . Highest education level: Not on file  Occupational History  . Not on file  Tobacco Use  . Smoking status: Never Smoker  . Smokeless tobacco: Never Used  Vaping Use  . Vaping Use: Never used  Substance and Sexual Activity  . Alcohol use: No    Alcohol/week: 0.0 standard drinks  . Drug use: No  . Sexual activity: Yes    Birth control/protection: Pill  Other Topics Concern   . Not on file  Social History Narrative   Married; works as a Pharmacologist at [employer], [employer].    Social Determinants of Health   Financial Resource Strain: Not on file  Food Insecurity: Not on file  Transportation Needs: Not on file  Physical Activity: Not on file  Stress: Not on file  Social Connections: Not on file  Intimate Partner Violence: Not on file    Family History  Problem Relation Age of Onset  . Hypertension Other        family hx   . Colon polyps Father   . Breast cancer Neg Hx   . Ovarian cancer Neg Hx   . Colon cancer Neg Hx     The following portions of the patient's history were reviewed and updated as appropriate: allergies, current medications, past family history, past medical history, past social history, past surgical history and problem list.  Review of Systems  ROS negative except as noted above. Information obtained from patient.    Objective:   BP 111/73   Pulse 79   Ht 5\' 9"  (1.753 m)   Wt 227 lb 4.8 oz (103.1 kg)   LMP 02/18/2020 (Approximate)   BMI 33.57 kg/m    CONSTITUTIONAL: Well-developed, well-nourished female in no acute distress.   PSYCHIATRIC: Normal mood and affect. Normal behavior. Normal judgment and thought content.  NEUROLGIC: Alert and oriented to person, place, and time. Normal muscle tone coordination. No  cranial nerve deficit noted.  HENT:  Normocephalic, atraumatic.  EYES: Conjunctivae and EOM are normal.   NECK: Normal range of motion, supple, no masses.  Normal thyroid.   SKIN: Skin is warm and dry. No rash noted. Not diaphoretic. No erythema. No pallor.  CARDIOVASCULAR: Normal heart rate noted, regular rhythm, no murmur.  RESPIRATORY: Clear to auscultation bilaterally. Effort and breath sounds normal, no problems with respiration noted.  BREASTS: Symmetric in size. No masses, skin changes, nipple drainage, or lymphadenopathy.  ABDOMEN: Soft, normal bowel sounds, no distention noted.  No  tenderness, rebound or guarding.   PELVIC:  External Genitalia: Normal  Vagina: Normal  Cervix: Normal  Uterus: Normal  Adnexa: Normal  MUSCULOSKELETAL: Normal range of motion. No tenderness.  No cyanosis, clubbing, or edema.  2+ distal pulses.  LYMPHATIC: No Axillary, Supraclavicular, or Inguinal Adenopathy.  Assessment:   Annual gynecologic examination 35 y.o.   Contraception: OCP (estrogen/progesterone)   Obesity 1   Problem List Items Addressed This Visit   None   Visit Diagnoses    Well woman exam    -  Primary   Relevant Orders  Hemoglobin A1c   Lipid panel   TSH+T4F+T3Free   Elevated cholesterol       Relevant Orders   Lipid panel   BMI 33.0-33.9,adult       Relevant Orders   Hemoglobin A1c   Lipid panel   TSH+T4F+T3Free      Plan:   Pap: Not needed  Labs: See orders   Routine preventative health maintenance measures emphasized: Exercise/Diet/Weight control, Tobacco Warnings, Alcohol/Substance use risks and Stress Management; see AVS  Rx [place], see orders  Reviewed red flag symptoms and when to call  Return to Clinic - 1 Year for [facility] or sooner if needed   Serafina Royals, CNM  [facility], [facility] 04/03/20 9:15 AM

## 2020-04-03 NOTE — Patient Instructions (Signed)
Levonorgestrel; Ethinyl Estradiol Tablets What is this medicine? LEVONORGESTREL; ETHINYL ESTRADIOL (LEE voh nor jes trel; ETH in il es tra DYE ole) is an oral contraceptive. The products combine two types of female hormones, an estrogen and a progestin. These products prevent ovulation and pregnancy. This medicine may be used for other purposes; ask your health care provider or pharmacist if you have questions. COMMON BRAND NAME(S): [place], [place], [place], Amethia, Amethia Lo, [place], [place], [place], Aubra-28, Aviane, Camrese, Camrese Lo, [place], [place], [place], [place], [place], [place], [place], [place], [place], [place], [place], [place], [employer], [place], [place], [place], [place], Levonorgestrel/Ethinyl Estradiol, [place], [place], [place], [place], [place], [place], [place], [place], [place], [place], [place], [place], [place], [place], [place], [place], [place], [place], [place], [place], [place], [place] What should I tell my health care provider before I take this medicine? They need to know if you have or ever had any of these conditions:  abnormal vaginal bleeding  blood vessel disease or blood clots  breast, cervical, endometrial, ovarian, liver, or uterine cancer  diabetes  gallbladder disease  having surgery  heart disease or recent heart attack  high blood pressure  high cholesterol or triglycerides  history of irregular heartbeat or heart valve problems  kidney disease  liver disease  migraine headaches  protein C deficiency  protein S deficiency  recently had a baby, miscarriage, or abortion  stroke  systemic lupus erythematosus (SLE)  tobacco smoker  an unusual or allergic reaction to estrogens, progestins, other medicines, foods, dyes, or preservatives  pregnant or trying to get pregnant  breast-feeding How should I use this medicine? Take this medicine by mouth. To reduce nausea, this medicine may be  taken with food. Follow the directions on the prescription label. Take this medicine at the same time each day and in the order directed on the package. Do not take your medicine more often than directed. Contact your pediatrician regarding the use of this medicine in children. Special care may be needed. This medicine has been used in female children who have started having menstrual periods. A patient package insert for the product will be given with each prescription and refill. Read this sheet carefully each time. The sheet may change frequently. Overdosage: If you think you have taken too much of this medicine contact a poison control center or emergency room at once. NOTE: This medicine is only for you. Do not share this medicine with others. What if I miss a dose? If you miss a dose, refer to the patient information sheet you received with your medicine for direction. If you miss more than one pill, this medicine may not be as effective and you may need to use another form of birth control. What may interact with this medicine? Do not take this medicine with the following medication:  dasabuvir; ombitasvir; paritaprevir; ritonavir  ombitasvir; paritaprevir; ritonavir This medicine may also interact with the following medications:  acetaminophen  antibiotics or medicines for infections, especially rifampin, rifabutin, rifapentine, and griseofulvin, and possibly penicillins or tetracyclines  aprepitant  ascorbic acid (vitamin C)  atorvastatin  barbiturate medicines, such as phenobarbital  bosentan  carbamazepine  caffeine  clofibrate  cyclosporine  dantrolene  doxercalciferol  felbamate  grapefruit juice  hydrocortisone  medicines for anxiety or sleeping problems, such as diazepam or temazepam  medicines for diabetes, including pioglitazone  mineral oil  modafinil  mycophenolate  nefazodone  oxcarbazepine  phenytoin  prednisolone  ritonavir or  other medicines for HIV infection or AIDS  rosuvastatin  selegiline  soy isoflavones supplements  St. John's wort  tamoxifen or raloxifene  theophylline  thyroid hormones  topiramate  warfarin This list may not describe all possible interactions. Give your health care provider a list of all the medicines, herbs, non-prescription drugs, or dietary supplements you use. Also tell them if you smoke, drink alcohol, or use illegal drugs. Some items may interact with your medicine. What should I watch for while using this medicine? Visit your doctor or health care professional for regular checks on your progress. You will need a regular breast and pelvic exam and Pap smear while on this medicine. Use an additional method of contraception during the first cycle that you take these tablets. If you have any reason to think you are pregnant, stop taking this medicine right away and contact your doctor or health care professional. If you are taking this medicine for hormone related problems, it may take several cycles of use to see improvement in your condition. Smoking increases the risk of getting a blood clot or having a stroke while you are taking birth control pills, especially if you are more than 36 years old. You are strongly advised not to smoke. This medicine can make your body retain fluid, making your fingers, hands, or ankles swell. Your blood pressure can go up. Contact your doctor or health care professional if you feel you are retaining fluid. This medicine can make you more sensitive to the sun. Keep out of the sun. If you cannot avoid being in the sun, wear protective clothing and use sunscreen. Do not use sun lamps or tanning beds/booths. If you wear contact lenses and notice visual changes, or if the lenses begin to feel uncomfortable, consult your eye care specialist. In some women, tenderness, swelling, or minor bleeding of the gums may occur. Notify your dentist if this  happens. Brushing and flossing your teeth regularly may help limit this. See your dentist regularly and inform your dentist of the medicines you are taking. If you are going to have elective surgery, you may need to stop taking this medicine before the surgery. Consult your health care professional for advice. This medicine does not protect you against HIV infection (AIDS) or any other sexually transmitted diseases. What side effects may I notice from receiving this medicine? Side effects that you should report to your doctor or health care professional as soon as possible:  allergic reactions such as skin rash or itching, hives, swelling of the lips, mouth, tongue, or throat  breast tissue changes or discharge  dark patches of skin on your forehead, cheeks, upper lip, and chin  depression  high blood pressure  migraines or severe, sudden headaches  signs and symptoms of a blood clot such as breathing problems; changes in vision; chest pain; severe, sudden headache; pain, swelling, warmth in the leg; trouble speaking; sudden numbness or weakness of the face, arm or leg  stomach pain  symptoms of vaginal infection like itching, irritation or unusual discharge  yellowing of the eyes or skin Side effects that usually do not require medical attention (report these to your doctor or health care professional if they continue or are bothersome):  acne  breast pain, tenderness  irregular vaginal bleeding or spotting, particularly during the first month of use  mild headache  nausea  weight gain (slight) This list may not describe all possible side effects. Call your doctor for medical advice about side effects. You may report side effects to FDA at 1-800-FDA-1088. Where should I keep my medicine? Keep out  of the reach of children. Store at room temperature between 15 and 30 degrees C (59 and 86 degrees F). Throw away any unused medicine after the expiration date. NOTE: This sheet is  a summary. It may not cover all possible information. If you have questions about this medicine, talk to your doctor, pharmacist, or health care provider.  2021 Elsevier/Gold Standard (2019-11-25 10:40:59)   Preventive Care 25-84 Years Old, Female Preventive care refers to lifestyle choices and visits with your health care provider that can promote health and wellness. This includes:  A yearly physical exam. This is also called an annual wellness visit.  Regular dental and eye exams.  Immunizations.  Screening for certain conditions.  Healthy lifestyle choices, such as: ? Eating a healthy diet. ? Getting regular exercise. ? Not using drugs or products that contain nicotine and tobacco. ? Limiting alcohol use. What can I expect for my preventive care visit? Physical exam Your health care provider may check your:  Height and weight. These may be used to calculate your BMI (body mass index). BMI is a measurement that tells if you are at a healthy weight.  Heart rate and blood pressure.  Body temperature.  Skin for abnormal spots. Counseling Your health care provider may ask you questions about your:  Past medical problems.  Family's medical history.  Alcohol, tobacco, and drug use.  Emotional well-being.  Home life and relationship well-being.  Sexual activity.  Diet, exercise, and sleep habits.  Work and work Statistician.  Access to firearms.  Method of birth control.  Menstrual cycle.  Pregnancy history. What immunizations do I need? Vaccines are usually given at various ages, according to a schedule. Your health care provider will recommend vaccines for you based on your age, medical history, and lifestyle or other factors, such as travel or where you work.   What tests do I need? Blood tests  Lipid and cholesterol levels. These may be checked every 5 years starting at age 66.  Hepatitis C test.  Hepatitis B test. Screening  Diabetes screening.  This is done by checking your blood sugar (glucose) after you have not eaten for a while (fasting).  STD (sexually transmitted disease) testing, if you are at risk.  BRCA-related cancer screening. This may be done if you have a family history of breast, ovarian, tubal, or peritoneal cancers.  Pelvic exam and Pap test. This may be done every 3 years starting at age 38. Starting at age 68, this may be done every 5 years if you have a Pap test in combination with an HPV test. Talk with your health care provider about your test results, treatment options, and if necessary, the need for more tests.   Follow these instructions at home: Eating and drinking  Eat a healthy diet that includes fresh fruits and vegetables, whole grains, lean protein, and low-fat dairy products.  Take vitamin and mineral supplements as recommended by your health care provider.  Do not drink alcohol if: ? Your health care provider tells you not to drink. ? You are pregnant, may be pregnant, or are planning to become pregnant.  If you drink alcohol: ? Limit how much you have to 0-1 drink a day. ? Be aware of how much alcohol is in your drink. In the [place] one drink equals one 12 oz bottle of beer (355 mL), one 5 oz glass of wine (148 mL), or one 1 oz glass of hard liquor (44 mL).   Lifestyle  Take daily  care of your teeth and gums. Brush your teeth every morning and night with fluoride toothpaste. Floss one time each day.  Stay active. Exercise for at least 30 minutes 5 or more days each week.  Do not use any products that contain nicotine or tobacco, such as cigarettes, e-cigarettes, and chewing tobacco. If you need help quitting, ask your health care provider.  Do not use drugs.  If you are sexually active, practice safe sex. Use a condom or other form of protection to prevent STIs (sexually transmitted infections).  If you do not wish to become pregnant, use a form of birth control. If you plan to become  pregnant, see your health care provider for a prepregnancy visit.  Find healthy ways to cope with stress, such as: ? Meditation, yoga, or listening to music. ? Journaling. ? Talking to a trusted person. ? Spending time with friends and family. Safety  Always wear your seat belt while driving or riding in a vehicle.  Do not drive: ? If you have been drinking alcohol. Do not ride with someone who has been drinking. ? When you are tired or distracted. ? While texting.  Wear a helmet and other protective equipment during sports activities.  If you have firearms in your house, make sure you follow all gun safety procedures.  Seek help if you have been physically or sexually abused. What's next?  Go to your health care provider once a year for an annual wellness visit.  Ask your health care provider how often you should have your eyes and teeth checked.  Stay up to date on all vaccines. This information is not intended to replace advice given to you by your health care provider. Make sure you discuss any questions you have with your health care provider. Document Revised: 09/01/2019 Document Reviewed: 09/14/2017 Elsevier Patient Education  2021 [employer].

## 2020-04-03 NOTE — Progress Notes (Signed)
Patient comes in today for annual exam. No problems or concerns today.

## 2020-04-04 LAB — TSH+T4F+T3FREE
Free T4: 1.35 ng/dL (ref 0.82–1.77)
T3, Free: 3 pg/mL (ref 2.0–4.4)
TSH: 3.28 u[IU]/mL (ref 0.450–4.500)

## 2020-04-04 LAB — LIPID PANEL
Chol/HDL Ratio: 4.1 ratio (ref 0.0–4.4)
Cholesterol, Total: 189 mg/dL (ref 100–199)
HDL: 46 mg/dL (ref >39)
LDL Chol Calc (NIH): 128 mg/dL — ABNORMAL HIGH (ref 0–99)
Triglycerides: 84 mg/dL (ref 0–149)
VLDL Cholesterol Cal: 15 mg/dL (ref 5–40)

## 2020-04-04 LAB — HEMOGLOBIN A1C
Est. average glucose Bld gHb Est-mCnc: 105 mg/dL
Hgb A1c MFr Bld: 5.3 % (ref 4.8–5.6)

## 2020-04-06 ENCOUNTER — Other Ambulatory Visit: Payer: Self-pay | Admitting: Certified Nurse Midwife

## 2020-04-10 NOTE — Telephone Encounter (Signed)
Refill given since [place] completed earlier in the week. Will need weight and blood pressure check prior to next Rx.    Serafina Royals, CNM [facility], [facility] 04/10/20 5:55 PM

## 2020-04-14 ENCOUNTER — Encounter: Payer: Self-pay | Admitting: Certified Nurse Midwife

## 2020-04-15 NOTE — Telephone Encounter (Signed)
Appt made 4/14

## 2020-04-23 ENCOUNTER — Other Ambulatory Visit: Payer: Self-pay | Admitting: Certified Nurse Midwife

## 2020-04-23 DIAGNOSIS — J302 Other seasonal allergic rhinitis: Secondary | ICD-10-CM

## 2020-04-23 MED ORDER — FLUTICASONE PROPIONATE 50 MCG/ACT NA SUSP: 1.0000 | Freq: Every day | NASAL | 2 refills | Status: DC

## 2020-04-23 NOTE — Progress Notes (Signed)
Rx Flonase, see orders.    Serafina Royals, CNM [facility], [facility] 04/23/20 3:46 PM

## 2020-04-30 ENCOUNTER — Encounter: Payer: Medicaid Other | Admitting: Certified Nurse Midwife

## 2020-05-13 ENCOUNTER — Encounter: Payer: Self-pay | Admitting: Certified Nurse Midwife

## 2020-05-29 ENCOUNTER — Encounter: Payer: Self-pay | Admitting: Certified Nurse Midwife

## 2020-05-29 ENCOUNTER — Other Ambulatory Visit: Payer: Self-pay

## 2020-05-29 ENCOUNTER — Ambulatory Visit (INDEPENDENT_AMBULATORY_CARE_PROVIDER_SITE_OTHER): Payer: Medicaid Other | Admitting: Certified Nurse Midwife

## 2020-05-29 VITALS — BP 112/79 | HR 73 | Wt 221.9 lb

## 2020-05-29 DIAGNOSIS — Z6832 Body mass index (BMI) 32.0-32.9, adult: Secondary | ICD-10-CM

## 2020-05-29 DIAGNOSIS — T50905A Adverse effect of unspecified drugs, medicaments and biological substances, initial encounter: Secondary | ICD-10-CM

## 2020-05-29 DIAGNOSIS — R634 Abnormal weight loss: Secondary | ICD-10-CM | POA: Diagnosis not present

## 2020-05-29 DIAGNOSIS — Z0130 Encounter for examination of blood pressure without abnormal findings: Secondary | ICD-10-CM | POA: Diagnosis not present

## 2020-05-29 MED ORDER — PHENTERMINE HCL 30 MG PO CAPS: 30.0000 mg | ORAL_CAPSULE | Freq: Every morning | ORAL | 1 refills | Status: DC

## 2020-05-29 NOTE — Patient Instructions (Signed)
Calorie Counting for Weight Loss Calories are units of energy. Your body needs a certain number of calories from food to keep going throughout the day. When you eat or drink more calories than your body needs, your body stores the extra calories mostly as fat. When you eat or drink fewer calories than your body needs, your body burns fat to get the energy it needs. Calorie counting means keeping track of how many calories you eat and drink each day. Calorie counting can be helpful if you need to lose weight. If you eat fewer calories than your body needs, you should lose weight. Ask your health care provider what a healthy weight is for you. For calorie counting to work, you will need to eat the right number of calories each day to lose a healthy amount of weight per week. A dietitian can help you figure out how many calories you need in a day and will suggest ways to reach your calorie goal.  A healthy amount of weight to lose each week is usually 1-2 lb (0.5-0.9 kg). This usually means that your daily calorie intake should be reduced by 500-750 calories.  Eating 1,200-1,500 calories a day can help most women lose weight.  Eating 1,500-1,800 calories a day can help most men lose weight. What do I need to know about calorie counting? Work with your health care provider or dietitian to determine how many calories you should get each day. To meet your daily calorie goal, you will need to:  Find out how many calories are in each food that you would like to eat. Try to do this before you eat.  Decide how much of the food you plan to eat.  Keep a food log. Do this by writing down what you ate and how many calories it had. To successfully lose weight, it is important to balance calorie counting with a healthy lifestyle that includes regular activity. Where do I find calorie information? The number of calories in a food can be found on a Nutrition Facts label. If a food does not have a Nutrition Facts  label, try to look up the calories online or ask your dietitian for help. Remember that calories are listed per serving. If you choose to have more than one serving of a food, you will have to multiply the calories per serving by the number of servings you plan to eat. For example, the label on a package of bread might say that a serving size is 1 slice and that there are 90 calories in a serving. If you eat 1 slice, you will have eaten 90 calories. If you eat 2 slices, you will have eaten 180 calories.   How do I keep a food log? After each time that you eat, record the following in your food log as soon as possible:  What you ate. Be sure to include toppings, sauces, and other extras on the food.  How much you ate. This can be measured in cups, ounces, or number of items.  How many calories were in each food and drink.  The total number of calories in the food you ate. Keep your food log near you, such as in a pocket-sized notebook or on an app or website on your mobile phone. Some programs will calculate calories for you and show you how many calories you have left to meet your daily goal. What are some portion-control tips?  Know how many calories are in a serving. This will   help you know how many servings you can have of a certain food.  Use a measuring cup to measure serving sizes. You could also try weighing out portions on a kitchen scale. With time, you will be able to estimate serving sizes for some foods.  Take time to put servings of different foods on your favorite plates or in your favorite bowls and cups so you know what a serving looks like.  Try not to eat straight from a food's packaging, such as from a bag or box. Eating straight from the package makes it hard to see how much you are eating and can lead to overeating. Put the amount you would like to eat in a cup or on a plate to make sure you are eating the right portion.  Use smaller plates, glasses, and bowls for smaller  portions and to prevent overeating.  Try not to multitask. For example, avoid watching TV or using your computer while eating. If it is time to eat, sit down at a table and enjoy your food. This will help you recognize when you are full. It will also help you be more mindful of what and how much you are eating. What are tips for following this plan? Reading food labels  Check the calorie count compared with the serving size. The serving size may be smaller than what you are used to eating.  Check the source of the calories. Try to choose foods that are high in protein, fiber, and vitamins, and low in saturated fat, trans fat, and sodium. Shopping  Read nutrition labels while you shop. This will help you make healthy decisions about which foods to buy.  Pay attention to nutrition labels for low-fat or fat-free foods. These foods sometimes have the same number of calories or more calories than the full-fat versions. They also often have added sugar, starch, or salt to make up for flavor that was removed with the fat.  Make a grocery list of lower-calorie foods and stick to it. Cooking  Try to cook your favorite foods in a healthier way. For example, try baking instead of frying.  Use low-fat dairy products. Meal planning  Use more fruits and vegetables. One-half of your plate should be fruits and vegetables.  Include lean proteins, such as chicken, turkey, and fish. Lifestyle Each week, aim to do one of the following:  150 minutes of moderate exercise, such as walking.  75 minutes of vigorous exercise, such as running. General information  Know how many calories are in the foods you eat most often. This will help you calculate calorie counts faster.  Find a way of tracking calories that works for you. Get creative. Try different apps or programs if writing down calories does not work for you. What foods should I eat?  Eat nutritious foods. It is better to have a nutritious,  high-calorie food, such as an avocado, than a food with few nutrients, such as a bag of potato chips.  Use your calories on foods and drinks that will fill you up and will not leave you hungry soon after eating. ? Examples of foods that fill you up are nuts and nut butters, vegetables, lean proteins, and high-fiber foods such as whole grains. High-fiber foods are foods with more than 5 g of fiber per serving.  Pay attention to calories in drinks. Low-calorie drinks include water and unsweetened drinks. The items listed above may not be a complete list of foods and beverages you can eat.   Contact a dietitian for more information.   What foods should I limit? Limit foods or drinks that are not good sources of vitamins, minerals, or protein or that are high in unhealthy fats. These include:  Candy.  Other sweets.  Sodas, specialty coffee drinks, alcohol, and juice. The items listed above may not be a complete list of foods and beverages you should avoid. Contact a dietitian for more information. How do I count calories when eating out?  Pay attention to portions. Often, portions are much larger when eating out. Try these tips to keep portions smaller: ? Consider sharing a meal instead of getting your own. ? If you get your own meal, eat only half of it. Before you start eating, ask for a container and put half of your meal into it. ? When available, consider ordering smaller portions from the menu instead of full portions.  Pay attention to your food and drink choices. Knowing the way food is cooked and what is included with the meal can help you eat fewer calories. ? If calories are listed on the menu, choose the lower-calorie options. ? Choose dishes that include vegetables, fruits, whole grains, low-fat dairy products, and lean proteins. ? Choose items that are boiled, broiled, grilled, or steamed. Avoid items that are buttered, battered, fried, or served with cream sauce. Items labeled as  crispy are usually fried, unless stated otherwise. ? Choose water, low-fat milk, unsweetened iced tea, or other drinks without added sugar. If you want an alcoholic beverage, choose a lower-calorie option, such as a glass of wine or light beer. ? Ask for dressings, sauces, and syrups on the side. These are usually high in calories, so you should limit the amount you eat. ? If you want a salad, choose a garden salad and ask for grilled meats. Avoid extra toppings such as bacon, cheese, or fried items. Ask for the dressing on the side, or ask for olive oil and vinegar or lemon to use as dressing.  Estimate how many servings of a food you are given. Knowing serving sizes will help you be aware of how much food you are eating at restaurants. Where to find more information  Centers for Disease Control and Prevention: www.cdc.gov  [place] Department of Agriculture: myplate.gov Summary  Calorie counting means keeping track of how many calories you eat and drink each day. If you eat fewer calories than your body needs, you should lose weight.  A healthy amount of weight to lose per week is usually 1-2 lb (0.5-0.9 kg). This usually means reducing your daily calorie intake by 500-750 calories.  The number of calories in a food can be found on a Nutrition Facts label. If a food does not have a Nutrition Facts label, try to look up the calories online or ask your dietitian for help.  Use smaller plates, glasses, and bowls for smaller portions and to prevent overeating.  Use your calories on foods and drinks that will fill you up and not leave you hungry shortly after a meal. This information is not intended to replace advice given to you by your health care provider. Make sure you discuss any questions you have with your health care provider. Document Revised: 02/14/2019 Document Reviewed: 02/14/2019 Elsevier Patient Education  2021 Elsevier Inc. Phentermine tablets or capsules What is this  medicine? PHENTERMINE (FEN ter meen) decreases your appetite. It is used with a reduced calorie diet and exercise to help you lose weight. This medicine may be used for   other purposes; ask your health care provider or pharmacist if you have questions. COMMON BRAND NAME(S): Adipex-P, Atti-Plex P, Atti-Plex P Spansule, Fastin, Lomaira, Pro-Fast, Tara-8 What should I tell my health care provider before I take this medicine? They need to know if you have any of these conditions:  agitation or nervousness  diabetes  glaucoma  heart disease  high blood pressure  history of drug abuse or addiction  history of stroke  kidney disease  lung disease called Primary Pulmonary Hypertension (PPH)  taken an MAOI like Carbex, Eldepryl, Marplan, Nardil, or Parnate in last 14 days  taking stimulant medicines for attention disorders, weight loss, or to stay awake  thyroid disease  an unusual or allergic reaction to phentermine, other medicines, foods, dyes, or preservatives  pregnant or trying to get pregnant  breast-feeding How should I use this medicine? Take this medicine by mouth with a glass of water. Follow the directions on the prescription label. Take your medicine at regular intervals. Do not take it more often than directed. Do not stop taking except on your doctor's advice. Talk to your pediatrician regarding the use of this medicine in children. While this drug may be prescribed for children 17 years or older for selected conditions, precautions do apply. Overdosage: If you think you have taken too much of this medicine contact a poison control center or emergency room at once. NOTE: This medicine is only for you. Do not share this medicine with others. What if I miss a dose? If you miss a dose, take it as soon as you can. If it is almost time for your next dose, take only that dose. Do not take double or extra doses. What may interact with this medicine? Do not take this medicine  with any of the following medications:  MAOIs like Carbex, Eldepryl, Marplan, Nardil, and Parnate This medicine may also interact with the following medications:  alcohol  certain medicines for depression, anxiety, or psychotic disorders  certain medicines for high blood pressure  linezolid  medicines for colds or breathing difficulties like pseudoephedrine or phenylephrine  medicines for diabetes  sibutramine  stimulant medicines for attention disorders, weight loss, or to stay awake This list may not describe all possible interactions. Give your health care provider a list of all the medicines, herbs, non-prescription drugs, or dietary supplements you use. Also tell them if you smoke, drink alcohol, or use illegal drugs. Some items may interact with your medicine. What should I watch for while using this medicine? Visit your doctor or health care provider for regular checks on your progress. Do not stop taking except on your health care provider's advice. You may develop a severe reaction. Your health care provider will tell you how much medicine to take. Do not take this medicine close to bedtime. It may prevent you from sleeping. You may get drowsy or dizzy. Do not drive, use machinery, or do anything that needs mental alertness until you know how this medicine affects you. Do not stand or sit up quickly, especially if you are an older patient. This reduces the risk of dizzy or fainting spells. Alcohol may increase dizziness and drowsiness. Avoid alcoholic drinks. This medicine may affect blood sugar levels. Ask your healthcare provider if changes in diet or medicines are needed if you have diabetes. Women should inform their health care provider if they wish to become pregnant or think they might be pregnant. Losing weight while pregnant is not advised and may cause harm to the   unborn child. Talk to your health care provider for more information. What side effects may I notice from  receiving this medicine? Side effects that you should report to your doctor or health care professional as soon as possible:  allergic reactions like skin rash, itching or hives, swelling of the face, lips, or tongue  breathing problems  changes in emotions or moods  changes in vision  chest pain or chest tightness  fast, irregular heartbeat  feeling faint or lightheaded  increased blood pressure  irritable  restlessness  tremors  seizures  signs and symptoms of a stroke like changes in vision; confusion; trouble speaking or understanding; severe headaches; sudden numbness or weakness of the face, arm or leg; trouble walking; dizziness; loss of balance or coordination  unusually weak or tired Side effects that usually do not require medical attention (report to your doctor or health care professional if they continue or are bothersome):  changes in taste  constipation or diarrhea  dizziness  dry mouth  headache  trouble sleeping  upset stomach This list may not describe all possible side effects. Call your doctor for medical advice about side effects. You may report side effects to FDA at 1-800-FDA-1088. Where should I keep my medicine? Keep out of the reach of children. This medicine can be abused. Keep your medicine in a safe place to protect it from theft. Do not share this medicine with anyone. Selling or giving away this medicine is dangerous and against the law. This medicine may cause harm and death if it is taken by other adults, children, or pets. Return medicine that has not been used to an official disposal site. Contact the DEA at 1-800-882-9539 or your city/county government to find a site. If you cannot return the medicine, mix any unused medicine with a substance like cat litter or coffee grounds. Then throw the medicine away in a sealed container like a sealed bag or coffee can with a lid. Do not use the medicine after the expiration date. Store at  room temperature between 20 and 25 degrees C (68 and 77 degrees F). Keep container tightly closed. NOTE: This sheet is a summary. It may not cover all possible information. If you have questions about this medicine, talk to your doctor, pharmacist, or health care provider.  2021 Elsevier/Gold Standard (2018-11-09 12:54:20)  

## 2020-05-29 NOTE — Progress Notes (Signed)
GYN ENCOUNTER NOTE  Subjective:       Amber [name] is a 35 y.o. G32P1001 female here for weight management visit.   Doing well on medication. Denies negative side effects.   Taking phentermine daily and topamax-when she remembers.   Denies difficulty breathing or respiratory distress and chest pain.    Gynecologic History  No LMP recorded. (Menstrual status: Oral contraceptives).  Contraception: OCP (estrogen/progesterone)  Last Pap: 2019. Results were: Neg/Neg  Obstetric History  OB History  Gravida Para Term Preterm AB Living  1 1 1     1   SAB IAB Ectopic Multiple Live Births          1    # Outcome Date GA Lbr Len/2nd Weight Sex Delivery Anes PTL Lv  1 Term 10/02/13   6 lb 11 oz (3.033 kg) F Vag-Spont EPI N LIV    Past Medical History:  Diagnosis Date  . Anemia    iron deficiency  . Arthritis   . Asthma    as a child   . Family history of adverse reaction to anesthesia    mother gets n/v with anesthesia   . GERD (gastroesophageal reflux disease)   . Headache(784.0)   . Heart murmur    as a baby   . History of kidney stones   . Morbid obesity (HCC)   . Palpitations   . Stress fracture    Medial tibial plateau; mild medial retinacular sprain and PF lig sprain   . Thyroid disease     Past Surgical History:  Procedure Laterality Date  . LAPAROSCOPIC CHOLECYSTECTOMY SINGLE SITE WITH INTRAOPERATIVE CHOLANGIOGRAM N/A 07/05/2016   Procedure: LAPAROSCOPIC CHOLECYSTECTOMY  WITH INTRAOPERATIVE CHOLANGIOGRAM;  Surgeon: 07/07/2016, MD;  Location: [facility];  Service: General;  Laterality: N/A;  . tubes in ears     multiple     Current Outpatient Medications on File Prior to Visit  Medication Sig Dispense Refill  . cetirizine (ZYRTEC) 10 MG tablet Take 10 mg by mouth daily.    . cyanocobalamin (,VITAMIN B-12,) 1000 MCG/ML injection Inject 1 mL (1,000 mcg total) into the muscle every 30 (thirty) days. 1 mL 2  . cyclobenzaprine (FLEXERIL) 5 MG tablet Take 5-10  mg by mouth 2 (two) times daily as needed.    . diclofenac (VOLTAREN) 50 MG EC tablet Take 50 mg by mouth 2 (two) times daily as needed.    . fluticasone (FLONASE) 50 MCG/ACT nasal spray Place 1 spray into both nostrils daily. 16 g 2  . levonorgestrel-ethinyl estradiol (SEASONALE) 0.15-0.03 MG tablet Take 1 tablet by mouth daily. 91 tablet 4  . montelukast (SINGULAIR) 10 MG tablet TAKE ONE TABLET AT BEDTIME *NEEDS APPT FOR ADDITIONAL REFILLS AFTER THIS ONE* 30 tablet 0  . Multiple Vitamins-Minerals (MULTIVITAMIN WITH MINERALS) tablet Take 1 tablet by mouth daily.    . pantoprazole (PROTONIX) 40 MG tablet TAKE 1 TABLET BY MOUTH TWICE DAILY BEFORE A MEAL 60 tablet 5  . SUMAtriptan (IMITREX) 50 MG tablet Take 1 tablet (50 mg total) by mouth every 2 (two) hours as needed for migraine. May repeat in 2 hours if headache persists or recurs. 10 tablet 0  . topiramate (TOPAMAX) 25 MG capsule Take 1 capsule (25 mg total) by mouth daily. 90 capsule 0  . levothyroxine (SYNTHROID) 50 MCG tablet Take 50 mcg by mouth daily.      No current facility-administered medications on file prior to visit.    Allergies  Allergen Reactions  .  Dilaudid [Hydromorphone Hcl] Other (See Comments)    Hallucinations  . Latex Rash    Social History   Socioeconomic History  . Marital status: Married    Spouse name: Not on file  . Number of children: 1  . Years of education: Not on file  . Highest education level: Not on file  Occupational History  . Not on file  Tobacco Use  . Smoking status: Never Smoker  . Smokeless tobacco: Never Used  Vaping Use  . Vaping Use: Never used  Substance and Sexual Activity  . Alcohol use: No    Alcohol/week: 0.0 standard drinks  . Drug use: No  . Sexual activity: Yes    Birth control/protection: Pill  Other Topics Concern  . Not on file  Social History Narrative   Married; works as a Pharmacologist at [employer], [employer].    Social Determinants of Health   Financial  Resource Strain: Not on file  Food Insecurity: Not on file  Transportation Needs: Not on file  Physical Activity: Not on file  Stress: Not on file  Social Connections: Not on file  Intimate Partner Violence: Not on file    Family History  Problem Relation Age of Onset  . Hypertension Other        family hx   . Colon polyps Father   . Breast cancer Neg Hx   . Ovarian cancer Neg Hx   . Colon cancer Neg Hx     The following portions of the patient's history were reviewed and updated as appropriate: allergies, current medications, past family history, past medical history, past social history, past surgical history and problem list.  Review of Systems  ROS negative except as noted above. Information obtained from patient.   Objective:   BP 112/79   Pulse 73   Wt 221 lb 14.4 oz (100.7 kg)   BMI 32.77 kg/m    ABDOMEN: 38 inches  Assessment:   1. Weight loss due to medication   2. BMI 32.0-32.9,adult   3. Blood pressure check   Plan:   Praise given for continued weight loss.   Rx Phentermine, see orders.   Reviewed red flag symptoms and when to call.   RTC x 2 months for weight management visit or sooner if needed.    Serafina Royals, CNM [facility], [facility] 05/29/20 11:55 AM

## 2020-06-02 ENCOUNTER — Other Ambulatory Visit: Payer: Self-pay | Admitting: Certified Nurse Midwife

## 2020-07-24 ENCOUNTER — Other Ambulatory Visit: Payer: Self-pay | Admitting: Certified Nurse Midwife

## 2020-07-30 ENCOUNTER — Encounter: Payer: Medicaid Other | Admitting: Certified Nurse Midwife

## 2020-08-13 ENCOUNTER — Encounter: Payer: Medicaid Other | Admitting: Certified Nurse Midwife

## 2020-08-13 NOTE — Progress Notes (Signed)
    GYNECOLOGY PROGRESS NOTE  Subjective:    Patient ID: Amber [name], female    DOB: 05/03/1985, 35 y.o.   MRN: [id number]  HPI  Patient is a 35 y.o. G6P1001 female who presents for weight management.  Doing well on medication. Denies negative side effects.  Taking phentermine daily and topamax-when she remembers.  Denies difficulty breathing or respiratory distress and chest pain.   Gynecologic History  LMP: 08/04/2020 Menstrual status: Oral contraceptives).   Contraception: OCP (estrogen/progesterone)   Last Pap: 2019. Results were: Neg/Neg  The following portions of the patient's history were reviewed and updated as appropriate: allergies, current medications, past family history, past medical history, past social history, past surgical history, and problem list.  Review of Systems  Pertinent items noted in HPI and remainder of comprehensive ROS otherwise negative.   Objective:     BP 122/77   Pulse 90   Resp 16   Ht 5\' 9"  (1.753 m)   Wt 221 lb 6.4 oz (100.4 kg)   LMP 08/04/2020 (Approximate)   BMI 32.70 kg/m   GENERAL: Alert and oriented x 4, no apparent distress.   WAIST Circumference: 36.5"  Assessment:   1. Medication management   2. Blood pressure check   3. BMI 32.0-32.9,adult      Plan:   Will stop medication for three (3) months and restarted if needed.    06-08-1994, CNM [facility], [facility] 08/14/20 9:48 AM    08/16/20, CMN [facility]

## 2020-08-13 NOTE — Patient Instructions (Signed)
Phentermine tablets or capsules What is this medication? PHENTERMINE (FEN ter meen) decreases your appetite. It is used with a reducedcalorie diet and exercise to help you lose weight. This medicine may be used for other purposes; ask your health care provider orpharmacist if you have questions. COMMON BRAND NAME(S): Adipex-P, Atti-Plex P, Atti-Plex P Spansule, Fastin,Lomaira, Pro-Fast, Tara-8 What should I tell my care team before I take this medication? They need to know if you have any of these conditions: agitation or nervousness diabetes glaucoma heart disease high blood pressure history of drug abuse or addiction history of stroke kidney disease lung disease called Primary Pulmonary Hypertension (PPH) taken an MAOI like Carbex, Eldepryl, Marplan, Nardil, or Parnate in last 14 days taking stimulant medicines for attention disorders, weight loss, or to stay awake thyroid disease an unusual or allergic reaction to phentermine, other medicines, foods, dyes, or preservatives pregnant or trying to get pregnant breast-feeding How should I use this medication? Take this medicine by mouth with a glass of water. Follow the directions on the prescription label. Take your medicine at regular intervals. Do not take itmore often than directed. Do not stop taking except on your doctor's advice. Talk to your pediatrician regarding the use of this medicine in children. While this drug may be prescribed for children 17 years or older for selectedconditions, precautions do apply. Overdosage: If you think you have taken too much of this medicine contact apoison control center or emergency room at once. NOTE: This medicine is only for you. Do not share this medicine with others. What if I miss a dose? If you miss a dose, take it as soon as you can. If it is almost time for yournext dose, take only that dose. Do not take double or extra doses. What may interact with this medication? Do not take this  medicine with any of the following medications: MAOIs like Carbex, Eldepryl, Marplan, Nardil, and Parnate This medicine may also interact with the following medications: alcohol certain medicines for depression, anxiety, or psychotic disorders certain medicines for high blood pressure linezolid medicines for colds or breathing difficulties like pseudoephedrine or phenylephrine medicines for diabetes sibutramine stimulant medicines for attention disorders, weight loss, or to stay awake This list may not describe all possible interactions. Give your health care provider a list of all the medicines, herbs, non-prescription drugs, or dietary supplements you use. Also tell them if you smoke, drink alcohol, or use illegaldrugs. Some items may interact with your medicine. What should I watch for while using this medication? Visit your doctor or health care provider for regular checks on your progress. Do not stop taking except on your health care provider's advice. You may develop a severe reaction. Your health care provider will tell you how muchmedicine to take. Do not take this medicine close to bedtime. It may prevent you from sleeping. You may get drowsy or dizzy. Do not drive, use machinery, or do anything that needs mental alertness until you know how this medicine affects you. Do not stand or sit up quickly, especially if you are an older patient. This reduces the risk of dizzy or fainting spells. Alcohol may increase dizziness anddrowsiness. Avoid alcoholic drinks. This medicine may affect blood sugar levels. Ask your healthcare provider ifchanges in diet or medicines are needed if you have diabetes. Women should inform their health care provider if they wish to become pregnant or think they might be pregnant. Losing weight while pregnant is not advised and may cause harm to the  unborn child. Talk to your health care provider formore information. What side effects may I notice from receiving  this medication? Side effects that you should report to your doctor or health care professionalas soon as possible: allergic reactions like skin rash, itching or hives, swelling of the face, lips, or tongue breathing problems changes in emotions or moods changes in vision chest pain or chest tightness fast, irregular heartbeat feeling faint or lightheaded increased blood pressure irritable restlessness tremors seizures signs and symptoms of a stroke like changes in vision; confusion; trouble speaking or understanding; severe headaches; sudden numbness or weakness of the face, arm or leg; trouble walking; dizziness; loss of balance or coordination unusually weak or tired Side effects that usually do not require medical attention (report to yourdoctor or health care professional if they continue or are bothersome): changes in taste constipation or diarrhea dizziness dry mouth headache trouble sleeping upset stomach This list may not describe all possible side effects. Call your doctor for medical advice about side effects. You may report side effects to FDA at1-800-FDA-1088. Where should I keep my medication? Keep out of the reach of children. This medicine can be abused. Keep your medicine in a safe place to protect it from theft. Do not share this medicine with anyone. Selling or giving away this medicine is dangerous and against thelaw. This medicine may cause harm and death if it is taken by other adults, children, or pets. Return medicine that has not been used to an official disposal site. Contact the DEA at (615) 603-3153 or your city/county government to find a site. If you cannot return the medicine, mix any unused medicine with a substance like cat litter or coffee grounds. Then throw the medicine away in a sealed container like a sealed bag or coffee can with a lid. Do not use themedicine after the expiration date. Store at room temperature between 20 and 25 degrees C (68 and 77  degrees F).Keep container tightly closed. NOTE: This sheet is a summary. It may not cover all possible information. If you have questions about this medicine, talk to your doctor, pharmacist, orhealth care provider.  2022 Elsevier/Gold Standard (2018-11-09 12:54:20) Calorie Counting for Weight Loss Calories are units of energy. Your body needs a certain number of calories from food to keep going throughout the day. When you eat or drink more calories than your body needs, your body stores the extra calories mostly as fat. When you eat or drink fewer calories than your body needs, your body burns fat to getthe energy it needs. Calorie counting means keeping track of how many calories you eat and drink each day. Calorie counting can be helpful if you need to lose weight. If you eat fewer calories than your body needs, you should lose weight. Ask yourhealth care provider what a healthy weight is for you. For calorie counting to work, you will need to eat the right number of calories each day to lose a healthy amount of weight per week. A dietitian can help you figure out how many calories you need in a day and will suggest ways to reach your calorie goal. A healthy amount of weight to lose each week is usually 1-2 lb (0.5-0.9 kg). This usually means that your daily calorie intake should be reduced by 500-750 calories. Eating 1,200-1,500 calories a day can help most women lose weight. Eating 1,500-1,800 calories a day can help most men lose weight. What do I need to know about calorie counting? Work with your  health care provider or dietitian to determine how many calories you should get each day. To meet your daily calorie goal, you will need to: Find out how many calories are in each food that you would like to eat. Try to do this before you eat. Decide how much of the food you plan to eat. Keep a food log. Do this by writing down what you ate and how many calories it had. To successfully lose weight,  it is important to balance calorie counting with ahealthy lifestyle that includes regular activity. Where do I find calorie information?  The number of calories in a food can be found on a Nutrition Facts label. If a food does not have a Nutrition Facts label, try to look up the calories onlineor ask your dietitian for help. Remember that calories are listed per serving. If you choose to have more than one serving of a food, you will have to multiply the calories per serving by the number of servings you plan to eat. For example, the label on a package of bread might say that a serving size is 1 slice and that there are 90 calories in a serving. If you eat 1 slice, you will have eaten 90 calories. If you eat 2slices, you will have eaten 180 calories. How do I keep a food log? After each time that you eat, record the following in your food log as soon as possible: What you ate. Be sure to include toppings, sauces, and other extras on the food. How much you ate. This can be measured in cups, ounces, or number of items. How many calories were in each food and drink. The total number of calories in the food you ate. Keep your food log near you, such as in a pocket-sized notebook or on an app or website on your mobile phone. Some programs will calculate calories for you andshow you how many calories you have left to meet your daily goal. What are some portion-control tips? Know how many calories are in a serving. This will help you know how many servings you can have of a certain food. Use a measuring cup to measure serving sizes. You could also try weighing out portions on a kitchen scale. With time, you will be able to estimate serving sizes for some foods. Take time to put servings of different foods on your favorite plates or in your favorite bowls and cups so you know what a serving looks like. Try not to eat straight from a food's packaging, such as from a bag or box. Eating straight from the  package makes it hard to see how much you are eating and can lead to overeating. Put the amount you would like to eat in a cup or on a plate to make sure you are eating the right portion. Use smaller plates, glasses, and bowls for smaller portions and to prevent overeating. Try not to multitask. For example, avoid watching TV or using your computer while eating. If it is time to eat, sit down at a table and enjoy your food. This will help you recognize when you are full. It will also help you be more mindful of what and how much you are eating. What are tips for following this plan? Reading food labels Check the calorie count compared with the serving size. The serving size may be smaller than what you are used to eating. Check the source of the calories. Try to choose foods that are high in  protein, fiber, and vitamins, and low in saturated fat, trans fat, and sodium. Shopping Read nutrition labels while you shop. This will help you make healthy decisions about which foods to buy. Pay attention to nutrition labels for low-fat or fat-free foods. These foods sometimes have the same number of calories or more calories than the full-fat versions. They also often have added sugar, starch, or salt to make up for flavor that was removed with the fat. Make a grocery list of lower-calorie foods and stick to it. Cooking Try to cook your favorite foods in a healthier way. For example, try baking instead of frying. Use low-fat dairy products. Meal planning Use more fruits and vegetables. One-half of your plate should be fruits and vegetables. Include lean proteins, such as chicken, [place], and fish. Lifestyle Each week, aim to do one of the following: 150 minutes of moderate exercise, such as walking. 75 minutes of vigorous exercise, such as running. General information Know how many calories are in the foods you eat most often. This will help you calculate calorie counts faster. Find a way of tracking  calories that works for you. Get creative. Try different apps or programs if writing down calories does not work for you. What foods should I eat?  Eat nutritious foods. It is better to have a nutritious, high-calorie food, such as an avocado, than a food with few nutrients, such as a bag of potato chips. Use your calories on foods and drinks that will fill you up and will not leave you hungry soon after eating. Examples of foods that fill you up are nuts and nut butters, vegetables, lean proteins, and high-fiber foods such as whole grains. High-fiber foods are foods with more than 5 g of fiber per serving. Pay attention to calories in drinks. Low-calorie drinks include water and unsweetened drinks. The items listed above may not be a complete list of foods and beverages you can eat. Contact a dietitian for more information. What foods should I limit? Limit foods or drinks that are not good sources of vitamins, minerals, or protein or that are high in unhealthy fats. These include: Candy. Other sweets. Sodas, specialty coffee drinks, alcohol, and juice. The items listed above may not be a complete list of foods and beverages you should avoid. Contact a dietitian for more information. How do I count calories when eating out? Pay attention to portions. Often, portions are much larger when eating out. Try these tips to keep portions smaller: Consider sharing a meal instead of getting your own. If you get your own meal, eat only half of it. Before you start eating, ask for a container and put half of your meal into it. When available, consider ordering smaller portions from the menu instead of full portions. Pay attention to your food and drink choices. Knowing the way food is cooked and what is included with the meal can help you eat fewer calories. If calories are listed on the menu, choose the lower-calorie options. Choose dishes that include vegetables, fruits, whole grains, low-fat dairy  products, and lean proteins. Choose items that are boiled, broiled, grilled, or steamed. Avoid items that are buttered, battered, fried, or served with cream sauce. Items labeled as crispy are usually fried, unless stated otherwise. Choose water, low-fat milk, unsweetened iced tea, or other drinks without added sugar. If you want an alcoholic beverage, choose a lower-calorie option, such as a glass of wine or light beer. Ask for dressings, sauces, and syrups on the side.  These are usually high in calories, so you should limit the amount you eat. If you want a salad, choose a garden salad and ask for grilled meats. Avoid extra toppings such as bacon, cheese, or fried items. Ask for the dressing on the side, or ask for olive oil and vinegar or lemon to use as dressing. Estimate how many servings of a food you are given. Knowing serving sizes will help you be aware of how much food you are eating at restaurants. Where to find more information Centers for Disease Control and Prevention: FootballExhibition.com.brwww.cdc.gov U.[place]. Department of Agriculture: WrestlingReporter.dkmyplate.gov Summary Calorie counting means keeping track of how many calories you eat and drink each day. If you eat fewer calories than your body needs, you should lose weight. A healthy amount of weight to lose per week is usually 1-2 lb (0.5-0.9 kg). This usually means reducing your daily calorie intake by 500-750 calories. The number of calories in a food can be found on a Nutrition Facts label. If a food does not have a Nutrition Facts label, try to look up the calories online or ask your dietitian for help. Use smaller plates, glasses, and bowls for smaller portions and to prevent overeating. Use your calories on foods and drinks that will fill you up and not leave you hungry shortly after a meal. This information is not intended to replace advice given to you by your health care provider. Make sure you discuss any questions you have with your healthcare provider. Document  Revised: 02/14/2019 Document Reviewed: 02/14/2019 Elsevier Patient Education  2022 [employer]

## 2020-08-14 ENCOUNTER — Other Ambulatory Visit: Payer: Self-pay

## 2020-08-14 ENCOUNTER — Encounter: Payer: Self-pay | Admitting: Certified Nurse Midwife

## 2020-08-14 ENCOUNTER — Ambulatory Visit (INDEPENDENT_AMBULATORY_CARE_PROVIDER_SITE_OTHER): Payer: Medicaid Other | Admitting: Certified Nurse Midwife

## 2020-08-14 VITALS — BP 122/77 | HR 90 | Resp 16 | Ht 69.0 in | Wt 221.4 lb

## 2020-08-14 DIAGNOSIS — Z0130 Encounter for examination of blood pressure without abnormal findings: Secondary | ICD-10-CM | POA: Diagnosis not present

## 2020-08-14 DIAGNOSIS — Z6832 Body mass index (BMI) 32.0-32.9, adult: Secondary | ICD-10-CM

## 2020-08-14 DIAGNOSIS — Z79899 Other long term (current) drug therapy: Secondary | ICD-10-CM | POA: Diagnosis not present

## 2020-11-12 NOTE — Progress Notes (Signed)
poc   GYNECOLOGY PROGRESS NOTE  Subjective:    Patient ID: Amber [name], female    DOB: Sep 29, 1985, 36 y.o.   MRN: [id number]  HPI  Patient is a 35 y.o. female who presents for 3 month weight management follow up. She is previously a patient of Serafina Royals, CNM. She has a past history of obesity, OSA, dyslipidemia (mild). She initiated use of  Phenterrmine and Topamax 14 months ago. Has been intermittently using since that time (holds for 1-2 months when she reaches a plateau of weight loss). Denies any undesirable side effects and reports compliance with medications. Stopped medication in July as she hit a plateau.  Began weight loss journey at 328 lbs. Now down to 221 lbs. Goal weight is to be less than 200 lbs.    Current interventions:  1. Diet - She does not eat breakfast. She grabs something quick like chic fil la. She tries to eat a healthy dinner, fish, salads. 2. Activity - She does not exercise 3. Reports bowel movements are normal.    The following portions of the patient's history were reviewed and updated as appropriate: allergies, current medications, past family history, past medical history, past social history, past surgical history, and problem list.  Review of Systems Pertinent items noted in HPI and remainder of comprehensive ROS otherwise negative.   Objective:    Vitals with BMI 11/13/2020 08/14/2020 05/29/2020  Height 5\' 9"  5\' 9"  -  Weight 225 lbs 3 oz 221 lbs 6 oz 221 lbs 14 oz  BMI 33.24 32.68 -  Systolic 124 122  Diastolic 74 77 79  Pulse 64 90 73    General appearance: alert, cooperative, and no distress Abdomen: soft, non-tender.  Waist circumference 38 in.    Labs:  Lab Results  Component Value Date   CHOL 189 04/03/2020   CHOL 201 (H) 03/29/2019   CHOL 174 02/02/2018   Lab Results  Component Value Date   HDL 46 04/03/2020   HDL 55 03/29/2019   HDL 51 02/02/2018   Lab Results  Component Value Date   LDLCALC 128 (H) 04/03/2020    LDLCALC 126 (H) 03/29/2019   LDLCALC 115 (H) 02/02/2018   Lab Results  Component Value Date   TRIG 84 04/03/2020   TRIG 112 03/29/2019   TRIG 41 02/02/2018   Lab Results  Component Value Date   CHOLHDL 4.1 04/03/2020   CHOLHDL 3.7 03/29/2019   CHOLHDL 3.4 02/02/2018   No results found for: LDLDIRECT   Assessment:   Weight management Obesity, Body mass index is 33.26 kg/m. Dyslipidemia (mild)  Plan:   Weight management  - Patient desires to resume Phentermine.  Also to continue with use of Topamax. Encouraged to continue with dietary modification (limiting high cholesterol foods).  Also encouraged engaging in some sort of physical activity as this can help to accelerate weight loss efforts and aid in maintaining overall health.  Will refill Vitamin B12 injections as well.  Dyslipidemia - mild, no meds currently. Will likely continue to improve with diet and exercise.    RTC in 2 months to follow up with weight management.   05/29/2019, MD [facility]

## 2020-11-13 ENCOUNTER — Other Ambulatory Visit: Payer: Self-pay

## 2020-11-13 ENCOUNTER — Ambulatory Visit (INDEPENDENT_AMBULATORY_CARE_PROVIDER_SITE_OTHER): Payer: Medicaid Other | Admitting: Obstetrics and Gynecology

## 2020-11-13 ENCOUNTER — Encounter: Payer: Self-pay | Admitting: Obstetrics and Gynecology

## 2020-11-13 ENCOUNTER — Encounter: Payer: Medicaid Other | Admitting: Obstetrics and Gynecology

## 2020-11-13 VITALS — BP 124/74 | HR 64 | Ht 69.0 in | Wt 225.2 lb

## 2020-11-13 DIAGNOSIS — Z6833 Body mass index (BMI) 33.0-33.9, adult: Secondary | ICD-10-CM

## 2020-11-13 DIAGNOSIS — Z7689 Persons encountering health services in other specified circumstances: Secondary | ICD-10-CM

## 2020-11-13 DIAGNOSIS — E785 Hyperlipidemia, unspecified: Secondary | ICD-10-CM | POA: Diagnosis not present

## 2020-11-13 MED ORDER — CYANOCOBALAMIN 1000 MCG/ML IJ SOLN: INTRAMUSCULAR | 3 refills | Status: DC

## 2020-11-13 MED ORDER — PHENTERMINE HCL 30 MG PO CAPS: 30.0000 mg | ORAL_CAPSULE | Freq: Every morning | ORAL | 1 refills | Status: DC

## 2020-11-13 NOTE — Patient Instructions (Signed)
Preventing Consequences of Unhealthy Weight Loss Behaviors, Adult Reaching and maintaining a healthy weight is important for your overall health. A healthy weight will vary from person to person. It is natural to want to lose weight quickly, using whatever methods seem fastest. However, losing weight in a healthy way is not a quick process. Instead, aim for slow, steady weight lossby making small changes and setting achievable goals. How can unhealthy weight loss behaviors affect me? Using unhealthy behaviors to try to lose weight can cause: Tiredness, low heart rate, and low blood pressure. Imbalances in your body. These may be imbalances in: Electrolytes. These are salts and minerals in your blood. Chemicals. These are needed so your body can work properly. Body fluids. Loss of fluids may lead to dehydration. Organ damage or organ failure, especially affecting the kidneys. Thin bones that break easily. Staying away from others, or relationship problems with your friends and family. Emotional problems, including depression and anxiety. A greater risk of an eating disorder. If you develop an eating disorder, you could develop serious health problems and complications that affect your organs and bodily processes. Changing unhealthy weight loss behaviors through lifestyle changes will improve your overall health. Maintaining a healthy weight also lowers your risk of certain conditions, such as: Type 2 diabetes. Heart disease, high cholesterol, and high blood pressure. Osteoarthritis. This affects your joints. Osteoporosis. This affects your bones. Some cancers. What can increase my risk for unhealthy weight loss behaviors? Certain views or feelings about yourself and certain habits can increase your risk of unhealthy weight loss behaviors. These include: Having depression and being overweight as an adult. Attempting weight loss as a child or teen. Using alcohol, drugs, or tobacco  products. What actions can I take to prevent these behaviors? You can make certain lifestyle changes to help you lose weight in a healthyway. These include eating nutritious foods and exercising regularly. Nutrition  Eat a variety of healthy foods, including fruits and vegetables, whole grains, lean proteins, and low-fat dairy products. Drink water instead of sugary drinks. Drink enough fluid to keep your urine pale yellow. Plan healthy, balanced meals. Work with a nutrition specialist (dietitian) to make a healthy meal plan that works for you. Limit the following: Foods that are high in fat, salt (sodium), or sugar. These include candy, donuts, pizza, and fast foods. Fried or heavily processed foods. Drinks that contain a lot of sugar.  Lifestyle Avoid these unhealthy eating habits: Following a diet that restricts entire types of food. This may be a popular diet that promises extreme results in a short time. Skipping meals to save calories. Not eating anything for long periods of time (fasting). Restricting your calories to far fewer than the number that you need to lose or maintain a healthy weight. Taking laxative pills to make you have more frequent bowel movements. Taking medicines to make your body lose excess fluids (diuretics). Eating an excessive amount of food and then making yourself vomit. This is known as bingeing and purging. If you drink alcohol: Limit how much you use to: 0-1 drink a day for nonpregnant women. 0-2 drinks a day for men. Be aware of how much alcohol is in your drink. In the [place] one drink equals one 12 oz bottle of beer (355 mL), one 5 oz glass of wine (148 mL), or one 1 oz glass of hard liquor (44 mL). Do not use any products that contain nicotine or tobacco, such as cigarettes, e-cigarettes, and chewing tobacco. If you need   help quitting, ask your health care provider. Activity  Avoid compulsively getting an extreme amount of exercise. Work with a  dietitian to make a healthy exercise program. Include different types of exercise in your exercise program, such as strengthening, aerobic, and flexibility exercises. To maintain your weight, get at least 150 minutes of moderate-intensity exercise each week. Moderate-intensity exercise could be brisk walking or biking. To lose a healthy amount of weight, get 60 minutes of moderate-intensity exercise each day. Find ways to reduce stress, such as regular exercise or meditation. Find a hobby or other activity that you enjoy to distract you from eating when you feel stressed or bored.  Where to find support For more support, talk with: Your health care provider or dietitian. Ask about support groups. A mental health care provider. Family and friends. Where to find more information Learn more about how to prevent complications from unhealthy weight loss behaviors from: Centers for Disease Control and Prevention: www.cdc.gov National Institute of Mental Health: www.nimh.nih.gov National Eating Disorders Association: www.nationaleatingdisorders.org Contact a health care provider if: You often feel very tired. You notice changes in your skin or your hair. You faint because of dehydration or too much exercise. You struggle to change your unhealthy weight loss behaviors on your own. Unhealthy weight loss behaviors are affecting your daily life or your relationships. You have signs or symptoms of an eating disorder. You have major weight changes in a short period of time. You feel guilty or ashamed about eating or exercising. Summary Using unhealthy eating behaviors to try to lose weight can cause a variety of physical and emotional problems that affect your overall health and well-being. Aim for slow, steady weight loss by choosing healthy foods, avoiding unhealthy eating habits, and exercising regularly. Contact your health care provider if you struggle to change your behaviors on your own or if  you think that you may have an eating disorder. This information is not intended to replace advice given to you by your health care provider. Make sure you discuss any questions you have with your healthcare provider. Document Revised: 11/27/2018 Document Reviewed: 11/27/2018 Elsevier Patient Education  2022 Elsevier Inc.  

## 2021-01-13 ENCOUNTER — Encounter: Payer: Medicaid Other | Admitting: Obstetrics and Gynecology

## 2021-02-09 ENCOUNTER — Encounter: Payer: Self-pay | Admitting: Obstetrics and Gynecology

## 2021-02-09 ENCOUNTER — Other Ambulatory Visit: Payer: Self-pay

## 2021-02-09 ENCOUNTER — Ambulatory Visit (INDEPENDENT_AMBULATORY_CARE_PROVIDER_SITE_OTHER): Payer: Medicaid Other | Admitting: Obstetrics and Gynecology

## 2021-02-09 VITALS — BP 117/84 | HR 69 | Ht 69.0 in | Wt 233.0 lb

## 2021-02-09 DIAGNOSIS — Z7689 Persons encountering health services in other specified circumstances: Secondary | ICD-10-CM

## 2021-02-09 DIAGNOSIS — E785 Hyperlipidemia, unspecified: Secondary | ICD-10-CM

## 2021-02-09 DIAGNOSIS — E669 Obesity, unspecified: Secondary | ICD-10-CM | POA: Diagnosis not present

## 2021-02-09 NOTE — Progress Notes (Signed)
poc   GYNECOLOGY PROGRESS NOTE  Subjective:    Patient ID: Amber [name], female    DOB: 03-22-1985, 36 y.o.   MRN: [id number]  HPI  Patient is a 36 y.o. female who presents for 3 month weight management follow up.  She has a past history of obesity, OSA, dyslipidemia (mild). She initiated use of  Phenterrmine and Topamax 17 months ago. Has been intermittently using since that time (holds for 1-2 months when she reaches a plateau of weight loss). Denies any undesirable side effects and reports compliance with medications. Resumed medication in November however notes that she was not compliant with her diet over the holidays so did not truly resume medication until this month.  Began weight loss journey at 328 lbs. Goal weight is to be less than 200 lbs.    Current interventions:  1. Diet - She does not eat breakfast. Starting on more of a keto type diet.  2. Activity - She does not exercise 3. Reports bowel movements are normal.    The following portions of the patient's history were reviewed and updated as appropriate: allergies, current medications, past family history, past medical history, past social history, past surgical history, and problem list.  Review of Systems Pertinent items noted in HPI and remainder of comprehensive ROS otherwise negative.   Objective:    Vitals with BMI 02/09/2021 11/13/2020 08/14/2020  Height 5\' 9"  5\' 9"  5\' 9"   Weight 233 lbs 225 lbs 3 oz 221 lbs 6 oz  BMI 34.39 33.24 32.68  Systolic 117 124  Diastolic 84 74 77  Pulse 69 64 90    General appearance: alert, cooperative, and no distress Abdomen: soft, non-tender.  Waist circumference 40.5 in.    Labs:  Lab Results  Component Value Date   CHOL 189 04/03/2020   HDL 46 04/03/2020   LDLCALC 128 (H) 04/03/2020   TRIG 84 04/03/2020   CHOLHDL 4.1 04/03/2020      Assessment:   Weight management Obesity, Body mass index is 34.41 kg/m. Dyslipidemia (mild)  Plan:   Weight management  -  Patient desires to resume Phentermine.  Also to continue with use of Topamax. Encouraged to continue with dietary modification (limiting high cholesterol foods).  Also encouraged engaging in some sort of physical activity as this can help to accelerate weight loss efforts and aid in maintaining overall health.  Will refill Vitamin B12 injections as well.  Dyslipidemia - mild, no meds currently. Will likely continue to improve with diet and exercise.    RTC in 3 months to follow up with weight management. Also due for annual exam at that time.   04/05/2020, MD [facility]

## 2021-04-05 ENCOUNTER — Encounter: Payer: Medicaid Other | Admitting: Certified Nurse Midwife

## 2021-05-12 ENCOUNTER — Encounter: Payer: Medicaid Other | Admitting: Obstetrics and Gynecology

## 2021-06-09 ENCOUNTER — Encounter: Payer: Medicaid Other | Admitting: Obstetrics and Gynecology

## 2021-06-17 ENCOUNTER — Encounter: Payer: Medicaid Other | Admitting: Obstetrics and Gynecology

## 2021-06-29 ENCOUNTER — Encounter: Payer: Medicaid Other | Admitting: Obstetrics and Gynecology

## 2021-07-12 NOTE — Progress Notes (Signed)
    GYNECOLOGY PROGRESS NOTE  Subjective:    Patient ID: Amber [name], female    DOB: August 23, 1985, 36 y.o.   MRN: [id number]  HPI  Patient is a 36 y.o. female who presents for 6 month weight management follow up. She has a past history of obesity, OSA, dyslipidemia (mild). She initiated use of Phertermine and Tompamax  23 months ago, uses intermittently with ~ 2 month breaks in between when she reaches a plateau with weight loss.  Last use was in early May. Notes that she has had some GI issues as well, her PCP recommended that she hold on the use of Phentermine until seen by a GI provider. Is awaiting her appointment (at [facility]).  Began weight loss journey at 328 lbs. Goal weight is to be less than 200 lbs.    Current interventions:  1. Diet - reports that her diet has not been as healthy as she would like.  2. Activity - Has been trying to do more walking since the weather is nice.  3. Reports bowel movements are normal.    The following portions of the patient's history were reviewed and updated as appropriate: allergies, current medications, past family history, past medical history, past social history, past surgical history, and problem list.  Review of Systems Pertinent items noted in HPI and remainder of comprehensive ROS otherwise negative.   Objective:       07/14/2021    8:35 AM 02/09/2021    8:48 AM 11/13/2020   10:44 AM  Vitals with BMI  Height 5\' 9"  5\' 9"  5\' 9"   Weight 244 lbs 233 lbs 225 lbs 3 oz  BMI 36.02 34.39 33.24  Systolic 127 117  Diastolic 83 84 74  Pulse 86 69 64    General appearance: alert and no distress Abdomen: soft, non-tender.  Waist circumference 42 in.    Labs:  No new labs  Assessment:   Weight management Obesity, Body mass index is 36.03 kg/m.  Plan:   Weight management  - currently not taking medication due to concerns from PCP. Discussed that after GI evaluation can decide whether to continue with Phentermine or consider  alternative weight loss plan (referral to weight loss management, dietary programs, other medications, although many may be cost prohibitive as patient with Medicaid).  Can continue topamax. Encouraged healthier eating options and including lifting small weights as weight has increased.  Patient scheduled for wellness exam next month. Will f/u with weight management then.    , MD [facility]

## 2021-07-14 ENCOUNTER — Encounter: Payer: Self-pay | Admitting: Obstetrics and Gynecology

## 2021-07-14 ENCOUNTER — Ambulatory Visit (INDEPENDENT_AMBULATORY_CARE_PROVIDER_SITE_OTHER): Payer: Medicaid Other | Admitting: Obstetrics and Gynecology

## 2021-07-14 VITALS — BP 127/83 | HR 86 | Ht 69.0 in | Wt 244.0 lb

## 2021-07-14 DIAGNOSIS — E785 Hyperlipidemia, unspecified: Secondary | ICD-10-CM

## 2021-07-14 DIAGNOSIS — E669 Obesity, unspecified: Secondary | ICD-10-CM

## 2021-07-14 DIAGNOSIS — Z7689 Persons encountering health services in other specified circumstances: Secondary | ICD-10-CM

## 2021-07-14 DIAGNOSIS — Z6836 Body mass index (BMI) 36.0-36.9, adult: Secondary | ICD-10-CM

## 2021-08-04 ENCOUNTER — Other Ambulatory Visit (HOSPITAL_COMMUNITY)
Admission: RE | Admit: 2021-08-04 | Discharge: 2021-08-04 | Disposition: A | Discharge status: Home / Self Care | Payer: Medicaid Other | Source: Ambulatory Visit | Attending: Obstetrics and Gynecology | Admitting: Obstetrics and Gynecology

## 2021-08-04 ENCOUNTER — Ambulatory Visit (INDEPENDENT_AMBULATORY_CARE_PROVIDER_SITE_OTHER): Payer: Medicaid Other | Admitting: Obstetrics and Gynecology

## 2021-08-04 ENCOUNTER — Encounter: Payer: Self-pay | Admitting: Obstetrics and Gynecology

## 2021-08-04 VITALS — BP 118/68 | HR 67 | Resp 16 | Ht 69.0 in | Wt 245.1 lb

## 2021-08-04 DIAGNOSIS — Z3041 Encounter for surveillance of contraceptive pills: Secondary | ICD-10-CM

## 2021-08-04 DIAGNOSIS — Z01419 Encounter for gynecological examination (general) (routine) without abnormal findings: Secondary | ICD-10-CM

## 2021-08-04 DIAGNOSIS — E669 Obesity, unspecified: Secondary | ICD-10-CM | POA: Diagnosis not present

## 2021-08-04 DIAGNOSIS — Z124 Encounter for screening for malignant neoplasm of cervix: Secondary | ICD-10-CM

## 2021-08-04 DIAGNOSIS — E785 Hyperlipidemia, unspecified: Secondary | ICD-10-CM

## 2021-08-04 DIAGNOSIS — Z0000 Encounter for general adult medical examination without abnormal findings: Secondary | ICD-10-CM

## 2021-08-04 DIAGNOSIS — Z79899 Other long term (current) drug therapy: Secondary | ICD-10-CM

## 2021-08-04 DIAGNOSIS — Z6836 Body mass index (BMI) 36.0-36.9, adult: Secondary | ICD-10-CM

## 2021-08-04 MED ORDER — PHENTERMINE HCL 30 MG PO CAPS: 30.0000 mg | ORAL_CAPSULE | Freq: Every morning | ORAL | 1 refills | Status: DC

## 2021-08-04 MED ORDER — LEVONORGEST-ETH ESTRAD 91-DAY 0.15-0.03 MG PO TABS
1.0000 | ORAL_TABLET | Freq: Every day | ORAL | 4 refills | Status: DC
Start: 2021-08-04 — End: 2022-07-25

## 2021-08-04 NOTE — Progress Notes (Signed)
GYNECOLOGY ANNUAL PHYSICAL EXAM PROGRESS NOTE  Subjective:    Amber [name] is a 36 y.o. G12P1001 female who presents for an annual exam. The patient has no complaints today. The patient is sexually active. The patient participates in regular exercise: yes (walking). Has the patient ever been transfused or tattooed?: no. The patient reports that there is not domestic violence in her life.   Went to her GI doctor who noted that the Phentermine was not likely the cause of her issues. Can resume as desired.   Menstrual History: Menarche age: 31 or 22 No LMP recorded (within months). (Menstrual status: Oral contraceptives). Period Cycle (Days): 60 Period Duration (Days): 5 Period Pattern: Regular Menstrual Flow: Moderate Dysmenorrhea: (!) Mild Dysmenorrhea Symptoms: Cramping   Gynecologic History:  Contraception: OCP (estrogen/progesterone) History of STI's:  Denies Last Pap: 01/18/2017. Results were: normal.  Denies  h/o abnormal pap smears.     Upstream - 08/08/21 1603       Pregnancy Intention Screening   Does the patient want to become pregnant in the next year? No    Does the patient's partner want to become pregnant in the next year? No    Would the patient like to discuss contraceptive options today? No      Contraception Wrap Up   Current Method Oral Contraceptive    End Method Oral Contraceptive    Contraception Counseling Provided No            The pregnancy intention screening data noted above was reviewed. Potential methods of contraception were discussed. The patient elected to proceed with Oral Contraceptive.   The pregnancy intention screening data noted above was reviewed. Potential methods of contraception were discussed. The patient elected to proceed with No data recorded.   OB History  Gravida Para Term Preterm AB Living  1 1 1  0 0 1  SAB IAB Ectopic Multiple Live Births  0 0 0 0 1    # Outcome Date GA Lbr Len/2nd Weight Sex Delivery Anes  PTL Lv  1 Term 10/02/13   6 lb 11 oz (3.033 kg) F Vag-Spont EPI N LIV     Name: Amber [name]    Past Medical History:  Diagnosis Date   Anemia    iron deficiency   Arthritis    Asthma    as a child    Family history of adverse reaction to anesthesia    mother gets n/v with anesthesia    GERD (gastroesophageal reflux disease)    Headache(784.0)    Heart murmur    as a baby    History of kidney stones    Morbid obesity (HCC)    Palpitations    Stress fracture    Medial tibial plateau; mild medial retinacular sprain and PF lig sprain    Thyroid disease     Past Surgical History:  Procedure Laterality Date   ENDOVENOUS ABLATION SAPHENOUS VEIN W/ LASER Right    LAPAROSCOPIC CHOLECYSTECTOMY SINGLE SITE WITH INTRAOPERATIVE CHOLANGIOGRAM N/A 07/05/2016   Procedure: LAPAROSCOPIC CHOLECYSTECTOMY  WITH INTRAOPERATIVE CHOLANGIOGRAM;  Surgeon: 07/07/2016, MD;  Location: [facility];  Service: General;  Laterality: N/A;   tubes in ears     multiple     Family History  Problem Relation Age of Onset   Hypertension Other        family hx    Colon polyps Father    Breast cancer Neg Hx    Ovarian cancer Neg Hx    Colon  cancer Neg Hx     Social History   Socioeconomic History   Marital status: Married    Spouse name: Not on file   Number of children: 1   Years of education: Not on file   Highest education level: Not on file  Occupational History   Not on file  Tobacco Use   Smoking status: Never   Smokeless tobacco: Never  Vaping Use   Vaping Use: Never used  Substance and Sexual Activity   Alcohol use: No    Alcohol/week: 0.0 standard drinks of alcohol   Drug use: No   Sexual activity: Yes    Birth control/protection: Pill  Other Topics Concern   Not on file  Social History Narrative   Married; works as a Pharmacologist at [employer], [employer].    Social Determinants of Health   Financial Resource Strain: Not on file  Food Insecurity: Not on file  Transportation Needs:  Not on file  Physical Activity: Not on file  Stress: Not on file  Social Connections: Not on file  Intimate Partner Violence: Not on file    Current Outpatient Medications on File Prior to Visit  Medication Sig Dispense Refill   cetirizine (ZYRTEC) 10 MG tablet Take 10 mg by mouth daily.     cyanocobalamin (,VITAMIN B-12,) 1000 MCG/ML injection INJECT INTO THE MUSCLE EVERY 30 DAYSAS DIRECTED 1 mL 3   cyclobenzaprine (FLEXERIL) 5 MG tablet Take 5-10 mg by mouth 2 (two) times daily as needed.     diclofenac (VOLTAREN) 50 MG EC tablet Take 50 mg by mouth 2 (two) times daily as needed.     esomeprazole (NEXIUM) 40 MG capsule Take 40 mg by mouth 2 (two) times daily before a meal.     fluconazole (DIFLUCAN) 100 MG tablet SMARTSIG:1 Tablet(s) By Mouth     fluticasone (FLONASE) 50 MCG/ACT nasal spray Place 1 spray into both nostrils daily. 16 g 2   levonorgestrel-ethinyl estradiol (SEASONALE) 0.15-0.03 MG tablet Take 1 tablet by mouth daily. 91 tablet 4   montelukast (SINGULAIR) 10 MG tablet TAKE ONE TABLET AT BEDTIME *NEEDS APPT FOR ADDITIONAL REFILLS AFTER THIS ONE* 30 tablet 0   Multiple Vitamins-Minerals (MULTIVITAMIN WITH MINERALS) tablet Take 1 tablet by mouth daily.     phentermine 30 MG capsule Take 1 capsule (30 mg total) by mouth every morning. 30 capsule 1   SUMAtriptan (IMITREX) 50 MG tablet Take 1 tablet (50 mg total) by mouth every 2 (two) hours as needed for migraine. May repeat in 2 hours if headache persists or recurs. 10 tablet 0   topiramate (TOPAMAX) 25 MG capsule Take 1 capsule (25 mg total) by mouth daily. 90 capsule 0   levothyroxine (SYNTHROID) 50 MCG tablet Take 50 mcg by mouth daily.      No current facility-administered medications on file prior to visit.    Allergies  Allergen Reactions   Dilaudid [Hydromorphone Hcl] Other (See Comments)    Hallucinations   Latex Rash     Review of Systems Constitutional: negative for chills, fatigue, fevers and  sweats Eyes: negative for irritation, redness and visual disturbance Ears, nose, mouth, throat, and face: negative for hearing loss, nasal congestion, snoring and tinnitus Respiratory: negative for asthma, cough, sputum Cardiovascular: negative for chest pain, dyspnea, exertional chest pressure/discomfort, irregular heart beat, palpitations and syncope Gastrointestinal: negative for abdominal pain, change in bowel habits, nausea and vomiting Genitourinary: negative for abnormal menstrual periods, genital lesions, sexual problems and vaginal discharge, dysuria and  urinary incontinence Integument/breast: negative for breast lump, breast tenderness and nipple discharge Hematologic/lymphatic: negative for bleeding and easy bruising Musculoskeletal:negative for back pain and muscle weakness Neurological: negative for dizziness, headaches, vertigo and weakness Endocrine: negative for diabetic symptoms including polydipsia, polyuria and skin dryness Allergic/Immunologic: negative for hay fever and urticaria      Objective:  Blood pressure 118/68, pulse 67, resp. rate 16, height 5\' 9"  (1.753 m), weight 245 lb 1.6 oz (111.2 kg), SpO2 100 %.  Body mass index is 36.19 kg/m.    General Appearance:    Alert, cooperative, no distress, appears stated age, moderate obesity  Head:    Normocephalic, without obvious abnormality, atraumatic  Eyes:    PERRL, conjunctiva/corneas clear, EOM's intact, both eyes  Ears:    Normal external ear canals, both ears  Nose:   Nares normal, septum midline, mucosa normal, no drainage or sinus tenderness  Throat:   Lips, mucosa, and tongue normal; teeth and gums normal  Neck:   Supple, symmetrical, trachea midline, no adenopathy; thyroid: no enlargement/tenderness/nodules; no carotid bruit or JVD  Back:     Symmetric, no curvature, ROM normal, no CVA tenderness  Lungs:     Clear to auscultation bilaterally, respirations unlabored  Chest Wall:    No tenderness or deformity    Heart:    Regular rate and rhythm, S1 and S2 normal, no murmur, rub or gallop  Breast Exam:    No tenderness, masses, or nipple abnormality  Abdomen:     Soft, non-tender, bowel sounds active all four quadrants, no masses, no organomegaly.    Genitalia:    Pelvic:external genitalia normal, vagina without lesions, discharge, or tenderness, rectovaginal septum  normal. Cervix normal in appearance, no cervical motion tenderness, no adnexal masses or tenderness.  Uterus normal size, shape, mobile, regular contours, nontender.  Rectal:    Normal external sphincter.  No hemorrhoids appreciated. Internal exam not done.   Extremities:   Extremities normal, atraumatic, no cyanosis or edema  Pulses:   2+ and symmetric all extremities  Skin:   Skin color, texture, turgor normal, no rashes or lesions  Lymph nodes:   Cervical, supraclavicular, and axillary nodes normal  Neurologic:   CNII-XII intact, normal strength, sensation and reflexes throughout   .  Labs:  Lab Results  Component Value Date   WBC 7.8 03/29/2019   HGB 12.5 03/29/2019   HCT 38.4 03/29/2019   MCV 90 03/29/2019   PLT 331 03/29/2019    Lab Results  Component Value Date   CREATININE 0.76 03/29/2019   BUN 10 03/29/2019   NA 139 03/29/2019   K 4.4 03/29/2019   CL 104 03/29/2019   CO2 22 03/29/2019    Lab Results  Component Value Date   ALT 17 03/29/2019   AST 16 03/29/2019   ALKPHOS 50 03/29/2019   BILITOT 0.4 03/29/2019    Lab Results  Component Value Date   TSH 3.280 04/03/2020     Assessment:   1. Well woman exam with routine gynecological exam   2. Screening for cervical cancer   3. Class 2 obesity without serious comorbidity with body mass index (BMI) of 36.0 to 36.9 in adult, unspecified obesity type   4. Dyslipidemia   5. Medication management   6. Surveillance for birth control, oral contraceptives      Plan:  - Blood tests: None ordered. - Breast self exam technique reviewed and patient  encouraged to perform self-exam monthly. - Contraception: OCP (estrogen/progesterone).  Refill  given.  - Discussed healthy lifestyle modifications.  - Mammogram:  to begin screens at age 53.  - Pap smear ordered. - Discussed weight management with Phentermine. Has been intermittently using the medication for the past 18 months, taking a hiatus when plateau is reached.  Recently stopped the medication due to concerns for it causing some GI issues, however has now been cleared by GI.  Advised that patient can now continue with Phentermine for weight loss, or can consider alternative option.  Patient ok to resume for now, but if any other issues arise, is willing to try a new weight loss medication.  - Follow up in 1 year for annual exam.  Will f/u in 2 months for weight loss visit.    Hildred Laser, MD [facility]

## 2021-08-09 LAB — CYTOLOGY - PAP
Comment: NEGATIVE
Diagnosis: NEGATIVE
High risk HPV: NEGATIVE

## 2021-10-05 ENCOUNTER — Encounter: Payer: Medicaid Other | Admitting: Obstetrics and Gynecology

## 2021-10-19 ENCOUNTER — Encounter: Payer: Medicaid Other | Admitting: Obstetrics and Gynecology

## 2021-10-28 IMAGING — CR DG LUMBAR SPINE 2-3V
1 series · 3 of 3 positions shown · non-contrast
Comparison: 01/07/2015

CLINICAL DATA: Pain in the low back radiating down both legs.

EXAM:
LUMBAR SPINE - 2-3 VIEW

[Series 1: dg lumbar spine 2-3 views · 0.14mm/px · 3 of 3 slices shown]
[im 1/3]
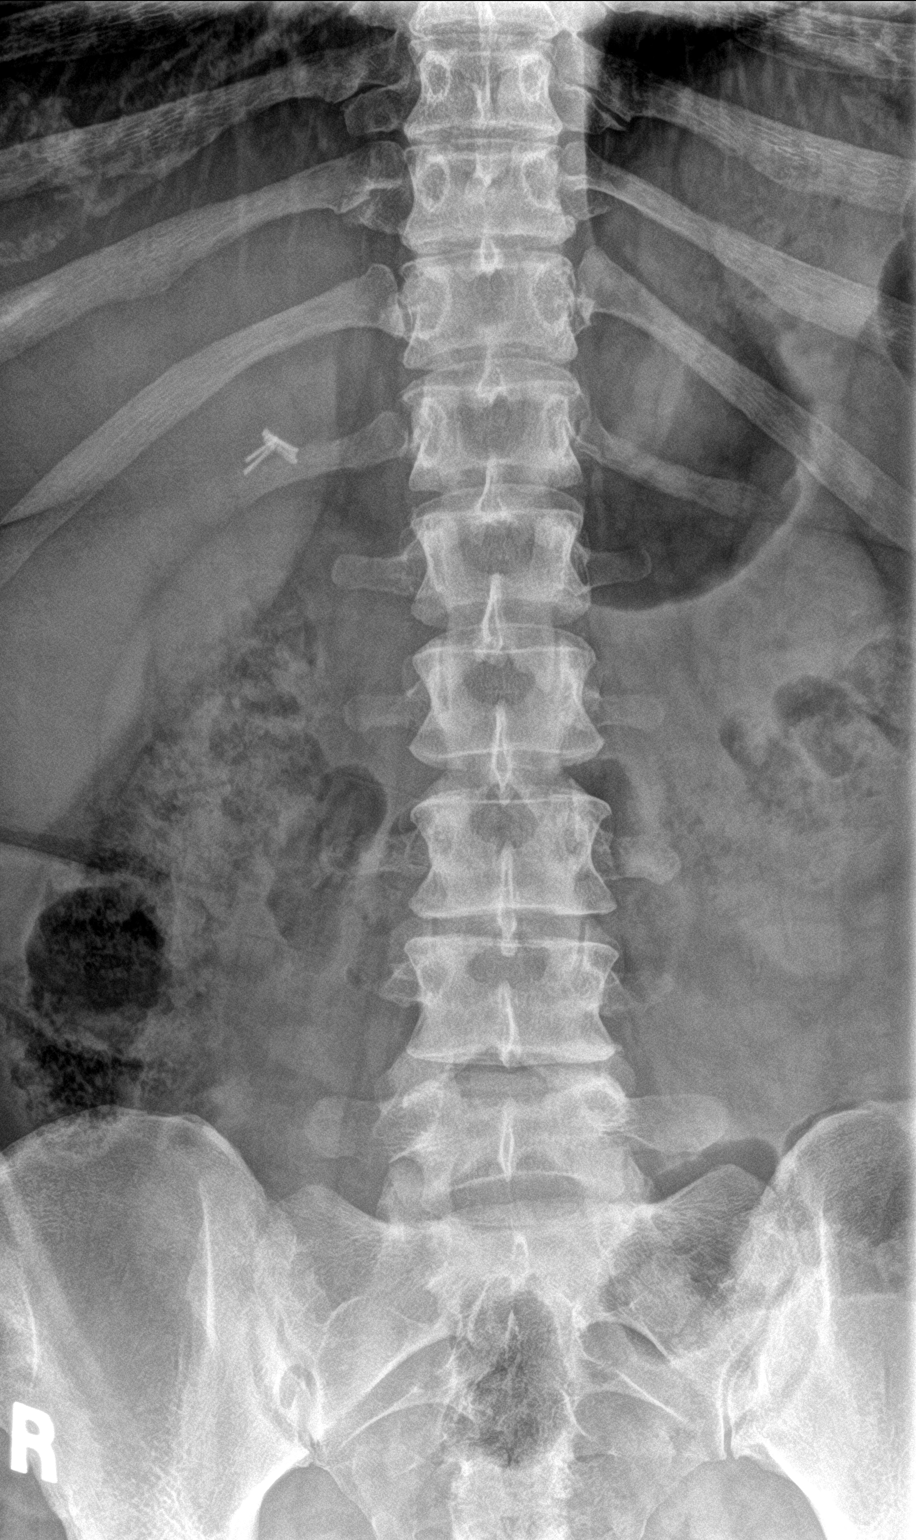
[im 2/3]
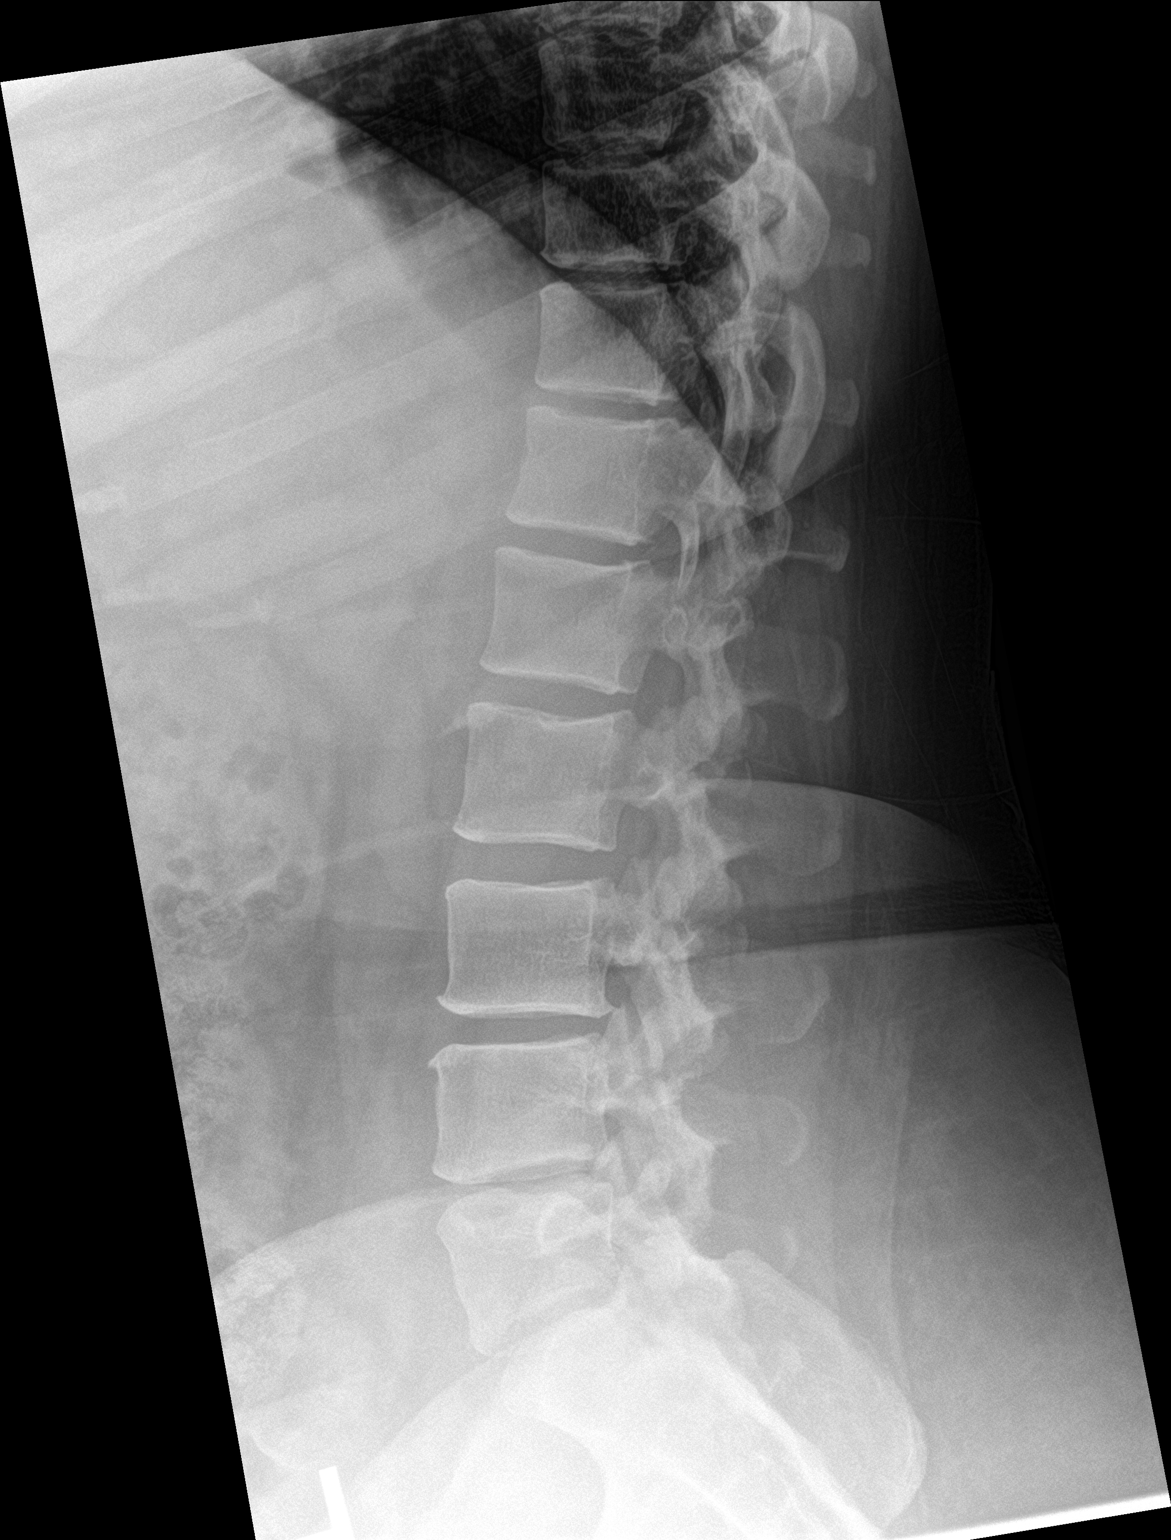
[im 3/3]
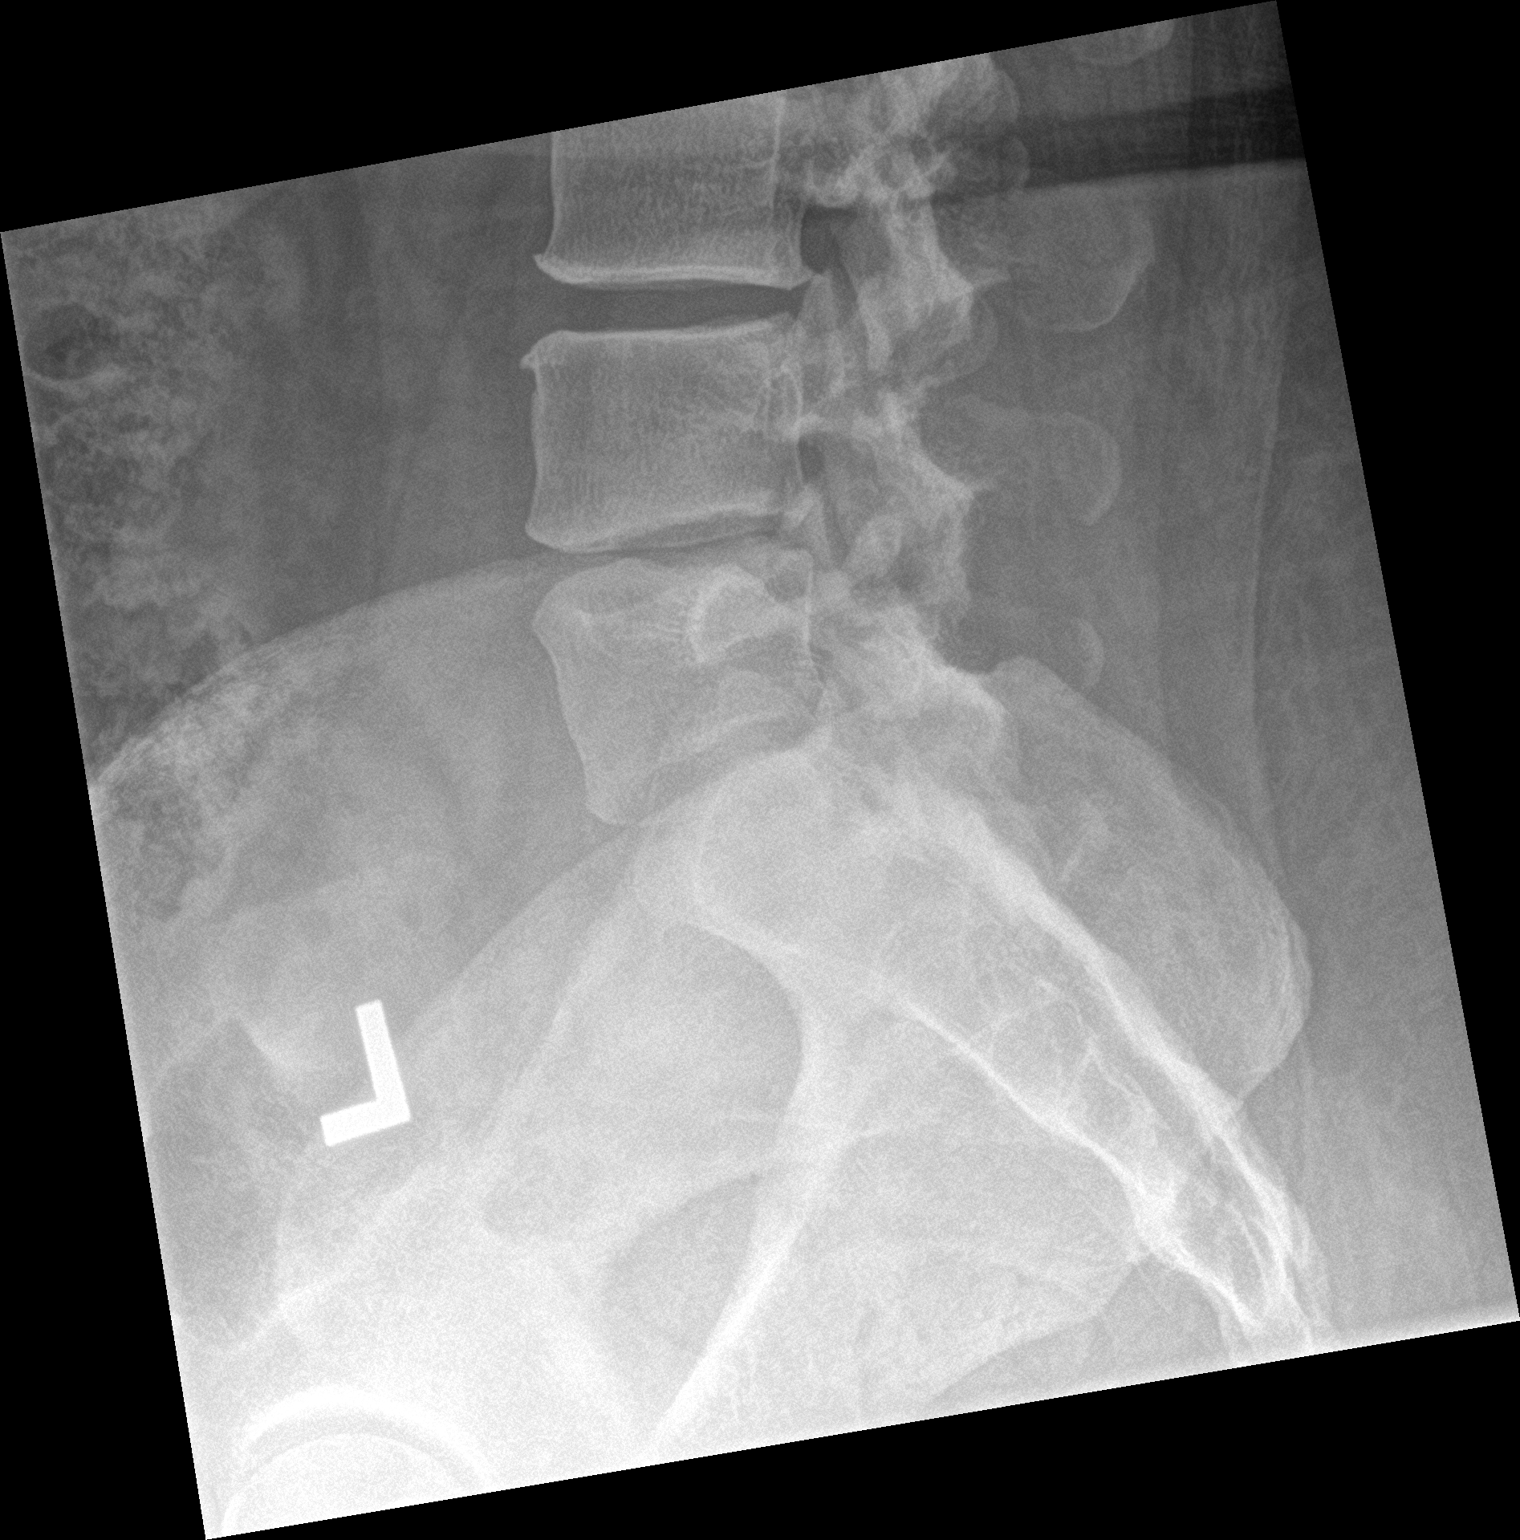

[3 of 3 positions shown; findings below may reference images not displayed]

FINDINGS: There is no evidence of lumbar spine fracture. Alignment is normal.
Disc space narrowing has progressed at L4-5 since the prior study
and is also present at L5-S1. Facet hypertrophy is noted in the
lower lumbar spine.
IMPRESSION: 1. Progressive disc space narrowing at L4-5 and L5-S1.
2. Facet hypertrophy in the lower lumbar spine.

## 2022-07-25 ENCOUNTER — Other Ambulatory Visit: Payer: Self-pay | Admitting: Obstetrics and Gynecology

## 2022-07-25 NOTE — Telephone Encounter (Signed)
Patient needs annual exam prior to next refill.  

## 2022-07-26 NOTE — Telephone Encounter (Signed)
LVM for patient to return call. 

## 2022-07-26 NOTE — Telephone Encounter (Signed)
This pt usually sees Dr. Valentino Saxon.  Dr. Valentino Saxon does not have any available appts for annuals.  Her August schedule is not fully open.

## 2022-07-27 NOTE — Telephone Encounter (Signed)
She is not scheduled, but usually sees [place].  Amber [name] does not have availability and her August schedule is not fully open.

## 2022-08-03 NOTE — Telephone Encounter (Signed)
I contacted the patient via phone, I left generic message for the patient to contact our office. Dr. Valentino Saxon has a opening for an annual exam for 8/8 at 8:15 or 9:15.

## 2022-08-04 NOTE — Telephone Encounter (Signed)
I contacted the patient via phone. I left generic message asking the patient to contact our office in regards to a medication refill request.

## 2022-08-09 NOTE — Telephone Encounter (Signed)
Multiple attempts to reach the patient have been unsuccessful. Please advise?

## 2022-11-23 ENCOUNTER — Other Ambulatory Visit: Payer: Self-pay

## 2023-02-06 ENCOUNTER — Other Ambulatory Visit: Payer: Self-pay | Admitting: Obstetrics and Gynecology

## 2023-02-07 ENCOUNTER — Other Ambulatory Visit: Payer: Self-pay | Admitting: Obstetrics and Gynecology

## 2023-02-22 ENCOUNTER — Ambulatory Visit (INDEPENDENT_AMBULATORY_CARE_PROVIDER_SITE_OTHER): Payer: Medicaid Other | Admitting: Family

## 2023-02-22 ENCOUNTER — Encounter: Payer: Self-pay | Admitting: Family

## 2023-02-22 DIAGNOSIS — M722 Plantar fascial fibromatosis: Secondary | ICD-10-CM

## 2023-02-22 MED ORDER — LIDOCAINE HCL 1 % IJ SOLN
2.0000 mL | INTRAMUSCULAR | Status: AC | PRN
  Administered 2023-02-22: 2 mL

## 2023-02-22 MED ORDER — METHYLPREDNISOLONE ACETATE 40 MG/ML IJ SUSP
40.0000 mg | INTRAMUSCULAR | Status: AC | PRN
  Administered 2023-02-22: 40 mg

## 2023-02-22 NOTE — Progress Notes (Signed)
 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  She has had pain which is worst with first waking in the morning and gradually improves with prolonged weightbearing but is unbearable by the end of the day.  She reports this has been ongoing for about 3 months without any improvement she has tried anti-inflammatories by mouth as well as icing the right is worse than the left.  She did get some new shoewear which she did not find to be helpful  Assessment & Plan: Visit Diagnoses: No diagnosis found.  Plan: Depo-Medrol  injections bilateral heels.  discussed conservative measures including heel cord stretching orthotics versus supportive walking shoes patient would like to proceed with the injections today she is also going to get an supportive walking shoes for work and was educated to typical course. Follow-Up Instructions: No follow-ups on file.   Ortho Exam  Patient is alert, oriented, no adenopathy, well-dressed, normal affect, normal respiratory effort. On examination bilateral feet she has point tenderness to the origin of the plantar fascia bilaterally no pain with lateral compression of the calcaneus there is no erythema no deformity  Imaging: No results found. No images are attached to the encounter.  Labs: Lab Results  Component Value Date   HGBA1C 5.3 04/03/2020   HGBA1C 5.5 03/29/2019   HGBA1C 5.7 (H) 02/02/2018   LABURIC 5.3 03/10/2015     Lab Results  Component Value Date   ALBUMIN 4.2 03/29/2019   ALBUMIN 4.2 02/02/2018   ALBUMIN 4.5  01/18/2017    No results found for: MG No results found for: VD25OH  No results found for: PREALBUMIN    Latest Ref Rng & Units 03/29/2019    8:59 AM 06/30/2016    8:09 AM 01/07/2015    9:43 AM  CBC EXTENDED  WBC 3.4 - 10.8 x10E3/uL 7.8  7.3  7.5   RBC 3.77 - 5.28 x10E6/uL 4.28  4.20  4.39   Hemoglobin 11.1 - 15.9 g/dL 87.4  87.9  87.2   HCT 34.0 - 46.6 % 38.4  37.0  39.2   Platelets 150 - 450 x10E3/uL 331  287  309   NEUT# 1.4 - 6.5 K/uL   4.8   Lymph# 1.0 - 3.6 K/uL   1.6      There is no height or weight on file to calculate BMI.  Orders:  No orders of the defined types were placed in this encounter.  No orders of the defined types were placed in this encounter.    Procedures: Foot Inj: left plantar fascia, right plantar fascia  Date/Time: 02/22/2023 11:12 AM  Performed by: Valdemar Rocky SAUNDERS, NP Authorized by: Valdemar Rocky SAUNDERS, NP   Consent Given by:  Patient Site marked: the procedure site was marked   Timeout: prior to procedure the correct patient, procedure, and site was verified  Indications:  Fasciitis and pain Condition: Plantar Fasciitis   Location: left plantar fascia muscle and right plantar fascia muscle   Prep: patient was prepped and draped in usual sterile fashion   Needle Size:  22 G Medications:  2 mL lidocaine  1 %; 40 mg methylPREDNISolone  acetate 40 MG/ML Patient Tolerance:  Patient tolerated the procedure well with no immediate complications   Clinical Data: No additional findings.  ROS:  All other systems negative, except as noted in the HPI. Review of Systems  Objective: Vital Signs: There were no vitals taken for this visit.  Specialty Comments:  No specialty comments available.  PMFS History: Patient Active Problem List   Diagnosis Date Noted   Dyslipidemia 11/13/2020   Abdominal pain, epigastric 05/17/2016   Atypical chest pain 05/17/2016   Great toe pain 03/10/2015   Right low back pain 01/08/2015   Acute left flank  pain 07/22/2014   RUQ abdominal pain 01/06/2010   HEARTBURN 08/14/2009   Anxiety state 03/27/2009   PAROXYSMAL TACHYCARDIA 03/27/2009   FRACTURE, TIBIAL PLATEAU 09/21/2008   ANEMIA-IRON DEFICIENCY 04/17/2008   GERD 04/17/2008   Obesity 12/04/2006   Obstructive sleep apnea (adult) (pediatric) 12/04/2006   TEMPOROMANDIBULAR JOINT PAIN 12/04/2006   Migraine 10/05/2006   Past Medical History:  Diagnosis Date   Anemia    iron deficiency   Arthritis    Asthma    as a child    Family history of adverse reaction to anesthesia    mother gets n/v with anesthesia    GERD (gastroesophageal reflux disease)    Headache(784.0)    Heart murmur    as a baby    History of kidney stones    Morbid obesity (HCC)    Palpitations    Stress fracture    Medial tibial plateau; mild medial retinacular sprain and PF lig sprain    Thyroid  disease     Family History  Problem Relation Age of Onset   Hypertension Other        family hx    Colon polyps Father    Breast cancer Neg Hx    Ovarian cancer Neg Hx    Colon cancer Neg Hx     Past Surgical History:  Procedure Laterality Date   ENDOVENOUS ABLATION SAPHENOUS VEIN W/ LASER Right    LAPAROSCOPIC CHOLECYSTECTOMY SINGLE SITE WITH INTRAOPERATIVE CHOLANGIOGRAM N/A 07/05/2016   Procedure: LAPAROSCOPIC CHOLECYSTECTOMY  WITH INTRAOPERATIVE CHOLANGIOGRAM;  Surgeon: Gladis Cough, MD;  Location: [facility];  Service: General;  Laterality: N/A;   tubes in ears     multiple    Social History   Occupational History   Not on file  Tobacco Use   Smoking status: Never   Smokeless tobacco: Never  Vaping Use   Vaping status: Never Used  Substance and Sexual Activity   Alcohol use: No    Alcohol/week: 0.0 standard drinks of alcohol   Drug use: No   Sexual activity: Yes    Birth control/protection: Pill

## 2023-03-06 ENCOUNTER — Ambulatory Visit (INDEPENDENT_AMBULATORY_CARE_PROVIDER_SITE_OTHER): Payer: Medicaid Other | Admitting: Certified Nurse Midwife

## 2023-03-06 VITALS — BP 120/63 | HR 75 | Ht 69.0 in | Wt 293.5 lb

## 2023-03-06 DIAGNOSIS — Z131 Encounter for screening for diabetes mellitus: Secondary | ICD-10-CM

## 2023-03-06 DIAGNOSIS — Z01419 Encounter for gynecological examination (general) (routine) without abnormal findings: Secondary | ICD-10-CM | POA: Diagnosis not present

## 2023-03-06 DIAGNOSIS — D229 Melanocytic nevi, unspecified: Secondary | ICD-10-CM

## 2023-03-06 DIAGNOSIS — E038 Other specified hypothyroidism: Secondary | ICD-10-CM

## 2023-03-06 MED ORDER — TOPIRAMATE 25 MG PO CPSP: 25.0000 mg | ORAL_CAPSULE | Freq: Every day | ORAL | 3 refills | Status: DC

## 2023-03-06 MED ORDER — LEVONORGEST-ETH ESTRAD 91-DAY 0.15-0.03 MG PO TABS: 1.0000 | ORAL_TABLET | Freq: Every day | ORAL | 4 refills | Status: DC

## 2023-03-06 NOTE — Progress Notes (Unsigned)
 ANNUAL EXAM Patient name: Amber [name] MRN [id number]  Date of birth: 10-31-1985 Chief Complaint:   Annual Exam  History of Present Illness:   Amber [name] is a 38 y.o. G88P1001 Caucasian female being seen today for a routine annual exam.  Current complaints: none, would like to have labs drawn today to assess thyroid & overall health. Needs refill for topamax for headache prevention.  No LMP recorded. (Menstrual status: Oral contraceptives).   The pregnancy intention screening data noted above was reviewed. Potential methods of contraception were discussed. The patient elected to proceed with Oral Contraceptive.      Component Value Date/Time   DIAGPAP  08/04/2021 1542    - Negative for intraepithelial lesion or malignancy (NILM)   HPVHIGH Negative 08/04/2021 1542   ADEQPAP  08/04/2021 1542    Satisfactory for evaluation; transformation zone component PRESENT.       Last pap ***. Results were: {Pap findings:25134}. H/O abnormal pap: {yes/yes***/no:23866} Last mammogram: ***. Results were: {normal, abnormal, n/a:23837}. Family h/o breast cancer: {yes***/no:23838} Last colonoscopy: ***. Results were: {normal, abnormal, n/a:23837}. Family h/o colorectal cancer: {yes***/no:23838}     03/06/2023    9:27 AM 08/14/2020    9:40 AM 05/29/2020    8:31 AM 05/09/2019   11:11 AM  Depression screen PHQ 2/9  Decreased Interest 1 0 0 0  Down, Depressed, Hopeless 0 0 0 0  PHQ - 2 Score 1 0 0 0         No data to display           Review of Systems:   Pertinent items are noted in HPI Denies any headaches, blurred vision, fatigue, shortness of breath, chest pain, abdominal pain, abnormal vaginal discharge/itching/odor/irritation, problems with periods, bowel movements, urination, or intercourse unless otherwise stated above. Pertinent History Reviewed:  Reviewed past medical,surgical, social and family history.  Reviewed problem list, medications and allergies. Physical  Assessment:   Vitals:   03/06/23 0840  BP: 120/63  Pulse: 75  Weight: 293 lb 8 oz (133.1 kg)  Height: 5\' 9"  (1.753 m)  Body mass index is 43.34 kg/m.       Physical Exam Vitals reviewed.  Constitutional:      Appearance: Normal appearance.  HENT:     Head: Normocephalic.  Neck:     Thyroid: No thyroid mass or thyromegaly.  Cardiovascular:     Rate and Rhythm: Normal rate and regular rhythm.     Heart sounds: Normal heart sounds.  Pulmonary:     Effort: Pulmonary effort is normal.     Breath sounds: Normal breath sounds.  Chest:  Breasts:    Tanner Score is 5.     Right: Normal.     Left: Normal.  Abdominal:     General: Abdomen is flat.     Palpations: Abdomen is soft.     Tenderness: There is no abdominal tenderness.  Musculoskeletal:     Cervical back: Neck supple. No tenderness.  Skin:    General: Skin is warm and dry.  Neurological:     General: No focal deficit present.     Mental Status: She is alert and oriented to person, place, and time.  Psychiatric:        Mood and Affect: Mood normal.        Behavior: Behavior normal.      No results found for this or any previous visit (from the past 24 hours).  Assessment & Plan:  1. Well woman  exam (Primary) - CBC - Hemoglobin A1c - Lipid panel - Vitamin B12 - TSH - VITAMIN D 25 Hydroxy (Vit-D Deficiency, Fractures) - Comprehensive metabolic panel  2. Screening for diabetes mellitus - Hemoglobin A1c  3. Other specified hypothyroidism - TSH  4. Skin mole - Ambulatory referral to Dermatology   Mammogram: {Mammo f/u:25212}, or sooner if problems Colonoscopy: {TCS f/u:25213}, or sooner if problems  Orders Placed This Encounter  Procedures   CBC   Hemoglobin A1c   Lipid panel   Vitamin B12   TSH   VITAMIN D 25 Hydroxy (Vit-D Deficiency, Fractures)   Comprehensive metabolic panel   Ambulatory referral to Dermatology    Meds:  Meds ordered this encounter  Medications    levonorgestrel-ethinyl estradiol (SEASONALE) 0.15-0.03 MG tablet    Sig: Take 1 tablet by mouth daily.    Dispense:  91 tablet    Refill:  4    PT HAS APPOINTMENT SCHEDULED FOR FEBRUARY 2025,PLEASE SEND REFILL TO GET HER THROUGH TIL THEN   topiramate (TOPAMAX) 25 MG capsule    Sig: Take 1 capsule (25 mg total) by mouth daily.    Dispense:  90 capsule    Refill:  3    Follow-up: Return in 1 year (on 03/05/2024) for Annual exam.  Dominica Severin, CNM 03/06/2023 9:28 AM

## 2023-03-06 NOTE — Patient Instructions (Signed)

## 2023-03-07 LAB — TSH: TSH: 5.35 u[IU]/mL — ABNORMAL HIGH (ref 0.450–4.500)

## 2023-03-07 LAB — HEMOGLOBIN A1C
Est. average glucose Bld gHb Est-mCnc: 117 mg/dL
Hgb A1c MFr Bld: 5.7 % — ABNORMAL HIGH (ref 4.8–5.6)

## 2023-03-07 LAB — CBC
Hematocrit: 39.9 % (ref 34.0–46.6)
Hemoglobin: 13.3 g/dL (ref 11.1–15.9)
MCH: 30.9 pg (ref 26.6–33.0)
MCHC: 33.3 g/dL (ref 31.5–35.7)
MCV: 93 fL (ref 79–97)
Platelets: 404 10*3/uL (ref 150–450)
RBC: 4.3 x10E6/uL (ref 3.77–5.28)
RDW: 12.7 % (ref 11.7–15.4)
WBC: 9.5 10*3/uL (ref 3.4–10.8)

## 2023-03-07 LAB — COMPREHENSIVE METABOLIC PANEL
ALT: 18 [IU]/L (ref 0–32)
AST: 12 [IU]/L (ref 0–40)
Albumin: 4.3 g/dL (ref 3.9–4.9)
Alkaline Phosphatase: 74 [IU]/L (ref 44–121)
BUN/Creatinine Ratio: 17 (ref 9–23)
BUN: 16 mg/dL (ref 6–20)
Bilirubin Total: 0.3 mg/dL (ref 0.0–1.2)
CO2: 24 mmol/L (ref 20–29)
Calcium: 9.5 mg/dL (ref 8.7–10.2)
Chloride: 100 mmol/L (ref 96–106)
Creatinine, Ser: 0.93 mg/dL (ref 0.57–1.00)
Globulin, Total: 2.5 g/dL (ref 1.5–4.5)
Glucose: 86 mg/dL (ref 70–99)
Potassium: 4.6 mmol/L (ref 3.5–5.2)
Sodium: 137 mmol/L (ref 134–144)
Total Protein: 6.8 g/dL (ref 6.0–8.5)
eGFR: 81 mL/min/{1.73_m2} (ref >59)

## 2023-03-07 LAB — LIPID PANEL
Chol/HDL Ratio: 3.8 {ratio} (ref 0.0–4.4)
Cholesterol, Total: 223 mg/dL — ABNORMAL HIGH (ref 100–199)
HDL: 59 mg/dL (ref >39)
LDL Chol Calc (NIH): 150 mg/dL — ABNORMAL HIGH (ref 0–99)
Triglycerides: 82 mg/dL (ref 0–149)
VLDL Cholesterol Cal: 14 mg/dL (ref 5–40)

## 2023-03-07 LAB — VITAMIN D 25 HYDROXY (VIT D DEFICIENCY, FRACTURES): Vit D, 25-Hydroxy: 27.2 ng/mL — ABNORMAL LOW (ref 30.0–100.0)

## 2023-03-07 LAB — VITAMIN B12: Vitamin B-12: 329 pg/mL (ref 232–1245)

## 2023-03-09 ENCOUNTER — Encounter: Payer: Self-pay | Admitting: Certified Nurse Midwife

## 2023-03-09 ENCOUNTER — Other Ambulatory Visit: Payer: Self-pay | Admitting: Certified Nurse Midwife

## 2023-03-09 DIAGNOSIS — E038 Other specified hypothyroidism: Secondary | ICD-10-CM

## 2023-03-09 MED ORDER — LEVOTHYROXINE SODIUM 75 MCG PO TABS: 75.0000 ug | ORAL_TABLET | Freq: Every day | ORAL | 1 refills | Status: DC

## 2023-03-09 MED ORDER — VITAMIN D (ERGOCALCIFEROL) 1.25 MG (50000 UNIT) PO CAPS: 50000.0000 [IU] | ORAL_CAPSULE | ORAL | 0 refills | Status: DC

## 2023-03-21 ENCOUNTER — Other Ambulatory Visit: Payer: Self-pay | Admitting: Certified Nurse Midwife

## 2023-03-21 DIAGNOSIS — G43009 Migraine without aura, not intractable, without status migrainosus: Secondary | ICD-10-CM

## 2023-03-21 MED ORDER — METHOCARBAMOL 1000 MG PO TABS: 1000.0000 mg | ORAL_TABLET | Freq: Four times a day (QID) | ORAL | 0 refills | Status: DC | PRN

## 2023-03-21 MED ORDER — IBUPROFEN 800 MG PO TABS: 800.0000 mg | ORAL_TABLET | Freq: Three times a day (TID) | ORAL | 1 refills | Status: DC | PRN

## 2023-03-21 NOTE — Progress Notes (Signed)
 TC to Amber [name], headaches continue despite stopping topamax 6d ago. Pain starts in right side of head/neck below & behind ear and continues up to forehead. Reports light sensitivity but feels it is due to the pain, denies visual changes, hearing changes, changes to grip strength or sensation in hands/arms. Will trial methocarbamol & ibuprofen given pain starts in neck. Has taken imitrex without relief and this pain is not her usual migraine pain. In agreement with neurology referral.

## 2023-03-22 MED ORDER — IBUPROFEN 800 MG PO TABS: 800.0000 mg | ORAL_TABLET | Freq: Three times a day (TID) | ORAL | 1 refills | Status: AC | PRN

## 2023-03-22 NOTE — Addendum Note (Signed)
 Addended by: Kathlene Cote on: 03/22/2023 08:37 AM   Modules accepted: Orders

## 2023-04-10 ENCOUNTER — Other Ambulatory Visit: Payer: Self-pay | Admitting: Certified Nurse Midwife

## 2023-04-11 ENCOUNTER — Other Ambulatory Visit

## 2023-04-11 DIAGNOSIS — E038 Other specified hypothyroidism: Secondary | ICD-10-CM

## 2023-04-12 ENCOUNTER — Telehealth: Payer: Self-pay | Admitting: Family

## 2023-04-12 ENCOUNTER — Encounter: Payer: Self-pay | Admitting: Certified Nurse Midwife

## 2023-04-12 LAB — TSH: TSH: 4.3 u[IU]/mL (ref 0.450–4.500)

## 2023-04-12 NOTE — Telephone Encounter (Signed)
 Called pt 1X and left vm. Per PA Erin pt need an appt with Dr Shon Baton for shock wave plantar fasciitis if pt would like.

## 2023-04-19 ENCOUNTER — Other Ambulatory Visit: Payer: Self-pay | Admitting: Certified Nurse Midwife

## 2023-04-19 MED ORDER — LEVOTHYROXINE SODIUM 88 MCG PO TABS: 88.0000 ug | ORAL_TABLET | Freq: Every day | ORAL | 1 refills | Status: DC

## 2023-04-25 ENCOUNTER — Encounter: Payer: Self-pay | Admitting: Sports Medicine

## 2023-04-25 ENCOUNTER — Ambulatory Visit (INDEPENDENT_AMBULATORY_CARE_PROVIDER_SITE_OTHER): Admitting: Sports Medicine

## 2023-04-25 DIAGNOSIS — M722 Plantar fascial fibromatosis: Secondary | ICD-10-CM

## 2023-04-25 DIAGNOSIS — M79672 Pain in left foot: Secondary | ICD-10-CM

## 2023-04-25 DIAGNOSIS — M6702 Short Achilles tendon (acquired), left ankle: Secondary | ICD-10-CM

## 2023-04-25 DIAGNOSIS — M6701 Short Achilles tendon (acquired), right ankle: Secondary | ICD-10-CM

## 2023-04-25 DIAGNOSIS — M79671 Pain in right foot: Secondary | ICD-10-CM | POA: Diagnosis not present

## 2023-04-25 NOTE — Progress Notes (Signed)
 Patient says that she has had bilateral heel pain for a few months that has gotten progressively worse. She says that she had injections at the beginning of February that helped for about a month, but her pain now feels a bit worse than it did prior to the injections. She is using ice, Ibuprofen, and stretching. She says that both of them hurt, but the right is a bit worse than the left. 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

## 2023-04-25 NOTE — Progress Notes (Signed)
 Amber [name] - 38 y.o. female MRN [id number]  Date of birth: 07-Jul-1985  Office Visit Note: Visit Date: 04/25/2023 PCP: Pcp, No Referred by: No ref. provider found  Subjective: Chief Complaint  Patient presents with   Right Heel - Pain   Left Heel - Pain   HPI: Amber [name] is a pleasant 38 y.o. 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  She has had bilateral heel pain for at least 6 months.  Inciting event or injury.  She was seen by one of my partners, Yetta Numbers (note reviewed today) on 02/22/2023 and had bilateral plantar fascia injections performed.  This gave her great relief for about 1 month although her pain is slowly started to return.  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  Using ibuprofen daily as needed.  Pertinent ROS were reviewed with the patient and found to be negative unless otherwise specified above in HPI.   Assessment & Plan: Visit Diagnoses:  1. Plantar fasciitis, bilateral   2. Heel pain, bilateral   3. 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  We discussed other conservative options and treatment, through shared decision making did proceed with bilateral extracorporeal shockwave therapy for her plantar fascia, patient tolerated well.  We did discuss the importance of shoe wear with both cushioning as well as rigid support.  I do think she would benefit from stretching of her Achilles, posterior chain and strengthening of the plantar fascia, we will plan on getting her set up for PT/HEP in the future.  At next visit we will update x-rays given it has been many years just to ensure no bony abnormality or calcaneal spurring is contributing.  She may continue her ibuprofen  as needed.  Follow-up in 1 week.  Follow-up: Return in about 1 week (around 05/02/2023) for for b/l plantar fascia (obtain 2-view foot XR's).   Meds & Orders: No orders of the defined types were placed in this encounter.  No orders of the defined types were placed in this encounter.   Procedures: Procedure: ECSWT Indications:  Plantar fascia syndrome, bilateral   Procedure Details Consent: Risks of procedure as well as the alternatives and risks of each were explained to the patient.  Verbal consent for procedure obtained. Time Out: Verified patient identification, verified procedure, site was marked, verified correct patient position. The area was cleaned with alcohol swab.     The right and left plantar fascia was targeted for Extracorporeal shockwave therapy.    Preset: Plantar fasciitis Power Level: 90 mJ Frequency: 10 Hz Impulse/cycles: 2200 (each foot) Head size: Regular   Patient tolerated procedure well without immediate complications.       Clinical History: No specialty comments available.  She reports that she has never smoked. She has never used smokeless tobacco.  Recent Labs    03/06/23 0916  HGBA1C 5.7*   *She is not diabetic, A1c reviewed above  Objective:    Physical Exam  Gen: Well-appearing, in no acute distress; non-toxic CV: Well-perfused. Warm.  Resp: Breathing unlabored on room air; no wheezing. Psych: Fluid speech in conversation; appropriate affect; normal thought process  Ortho Exam - Bilateral feet: There is no redness swelling or effusion  about the ankle or foot.  There is mild TTP on the plantar aspect of the calcaneus near the plantar fascia between the medial and lateral band.  There is relatively well-preserved longitudinal arch.  There is a mild degree of Achilles tendon contracture bilaterally.  Imaging:  *Review x-ray of the left foot from 03/10/2015 was independent reviewed interpreted by myself today.  There does appear to be mild  hammertoe deformities of the mid toes.  There is a very small calcification off the superior calcaneus likely from distal insertional Achilles tendinopathy, no acute fracture or otherwise acute bony abnormality noted.  Narrative & Impression  CLINICAL DATA:  Left first toe pain, no known injury, initial encounter   EXAM: LEFT FOOT - COMPLETE 3+ VIEW   COMPARISON:  None.   FINDINGS: There is no evidence of fracture or dislocation. There is no evidence of arthropathy or other focal bone abnormality. Soft tissues are unremarkable.   IMPRESSION: No acute abnormality noted.     Electronically Signed   By: Alcide Clever M.D.   On: 03/10/2015 16:21    Past Medical/Family/Surgical/Social History: Medications & Allergies reviewed per EMR, new medications updated. Patient Active Problem List   Diagnosis Date Noted   Dyslipidemia 11/13/2020   Abdominal pain, epigastric 05/17/2016   Atypical chest pain 05/17/2016   Great toe pain 03/10/2015   Right low back pain 01/08/2015   Acute left flank pain 07/22/2014   RUQ abdominal pain 01/06/2010   HEARTBURN 08/14/2009   Anxiety state 03/27/2009   PAROXYSMAL TACHYCARDIA 03/27/2009   FRACTURE, TIBIAL PLATEAU 09/21/2008   ANEMIA-IRON DEFICIENCY 04/17/2008   GERD 04/17/2008   Obesity 12/04/2006   Obstructive sleep apnea (adult) (pediatric) 12/04/2006   TEMPOROMANDIBULAR JOINT PAIN 12/04/2006   Migraine 10/05/2006   Past Medical History:  Diagnosis Date   Anemia    iron deficiency   Arthritis    Asthma    as a child    Family history of adverse reaction to anesthesia    mother gets n/v with anesthesia    GERD (gastroesophageal reflux disease)    Headache(784.0)    Heart murmur    as a baby    History of kidney stones    Morbid obesity (HCC)    Palpitations    Stress fracture    Medial tibial plateau; mild medial retinacular sprain and PF lig sprain    Thyroid disease    Family History  Problem Relation Age of Onset    Hypertension Other        family hx    Colon polyps Father    Breast cancer Neg Hx    Ovarian cancer Neg Hx    Colon cancer Neg Hx    Past Surgical History:  Procedure Laterality Date   ENDOVENOUS ABLATION SAPHENOUS VEIN W/ LASER Right    LAPAROSCOPIC CHOLECYSTECTOMY SINGLE SITE WITH INTRAOPERATIVE CHOLANGIOGRAM N/A 07/05/2016   Procedure: LAPAROSCOPIC CHOLECYSTECTOMY  WITH INTRAOPERATIVE CHOLANGIOGRAM;  Surgeon: [name], MD;  Location: [facility];  Service: General;  Laterality: N/A;   tubes in ears     multiple    Social History   Occupational History   Not on file  Tobacco Use   Smoking status: Never   Smokeless tobacco: Never  Vaping Use   Vaping status: Never Used  Substance and Sexual Activity   Alcohol use: No    Alcohol/week: 0.0 standard drinks of alcohol   Drug use: No   Sexual activity: Yes    Birth control/protection: Pill

## 2023-05-03 ENCOUNTER — Other Ambulatory Visit (INDEPENDENT_AMBULATORY_CARE_PROVIDER_SITE_OTHER)

## 2023-05-03 ENCOUNTER — Ambulatory Visit: Admitting: Sports Medicine

## 2023-05-03 ENCOUNTER — Encounter: Payer: Self-pay | Admitting: Sports Medicine

## 2023-05-03 ENCOUNTER — Other Ambulatory Visit: Payer: Self-pay

## 2023-05-03 DIAGNOSIS — M722 Plantar fascial fibromatosis: Secondary | ICD-10-CM

## 2023-05-03 DIAGNOSIS — M7732 Calcaneal spur, left foot: Secondary | ICD-10-CM

## 2023-05-03 DIAGNOSIS — M7731 Calcaneal spur, right foot: Secondary | ICD-10-CM

## 2023-05-03 NOTE — Progress Notes (Incomplete)
 Office Visit Note   Patient: Amber [name]           Date of Birth: Aug 19, 1985           MRN: [id number] Visit Date: 05/03/2023              Requested by: No referring provider defined for this encounter. PCP: Pcp, No  Medical Resident, Sports Medicine Fellow - Attending Physician Addendum:   I have independently interviewed and examined the patient myself. I have discussed the above with the original author and agree with their documentation. My edits for correction/addition/clarification have been made, see any changes above and below.   In summary, pleasant 38 year old female who presents with bilateral plantar fascia syndrome but I also believe she does have some degree of symptomatic calcaneal heel spurs which is more prominent on the left foot.  She did receive a positive response to extracorporeal shockwave therapy, we did repeat that treatment today.  We also discussed getting started in therapy, she is agreeable to home therapy versus formal PT.  We did print out a customized handout for the plantar fascia, Achilles and posterior complex of the feet, my athletic trainer Isabelle Course did review these exercises with her in the room today.  She will perform these once daily.  Follow-up in the next 1-2 weeks for repeat evaluation and further shockwave treatment if finding continued to benefit.  Okay for over-the-counter anti-inflammatories as needed.  Madelyn Brunner, DO Primary Care Sports Medicine Physician  [facility] - Orthopedics   Assessment & Plan: Visit Diagnoses:  1. Plantar fasciitis, bilateral   2. Calcaneal spur of left foot   3. Heel spur, right    Plan: Discussed with patient that a lot of her pain may be coming from the spurring location continuous that her x-rays show.  Given improvement from previous shockwave.  Patient noted improvement after today's session, we will continue with at least maybe 2 more sessions.  Will also go ahead and provide patient with a 3/16  heel lift to relieve some pressure off of the calcaneus.  Patient follow-up in 1 week and at that time we will repeat shockwave  Follow-Up Instructions: Return in about 1 week (around 05/10/2023) for for b/l heels (reg visit) - ok to do injection slot time.   Orders:  Orders Placed This Encounter  Procedures   XR Foot 2 Views Right   XR Foot 2 Views Left   No orders of the defined types were placed in this encounter.   Procedures:  Procedure: ECSWT Indications:  Plantar fascia syndrome, bilateral session #2   Procedure Details Consent: Risks of procedure as well as the alternatives and risks of each were explained to the patient.  Verbal consent for procedure obtained. Time Out: Verified patient identification, verified procedure, site was marked, verified correct patient position. The area was cleaned with alcohol swab.     The right and left plantar fascia was targeted for Extracorporeal shockwave therapy.    Preset: Plantar fasciitis Power Level: 120 mJ Frequency: 12 Hz Impulse/cycles: 2400 (each foot) Head size: Regular   Patient tolerated procedure well without immediate complications.   Clinical Data: No additional findings.  Subjective: Chief Complaint  Patient presents with   Right Foot - Follow-up   Left Foot - Follow-up    Patient presenting for follow-up of bilateral plantar fascitis.  Patient notes that after the previous session of shockwave therapy she feels like she got 20% better.  Patient notes  that she definitely had improvement in her pain.  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  Patient does have work later today for approximately 10-hour shifts.  Patient like to go ahead and do shockwave therapy today.    Review of Systems   Objective:  Physical Exam  Ortho Exam Inspection reveals no gross abnormalities of the bilateral foot.  There is some noted tenderness to palpation over the calcaneal heel near the plantar  fascial insertion.  No tenderness over the plantar fascia as it travels distally.  No tenderness over the Achilles tendon or posterior aspect of the calcaneus.  Range of motion is full with plantar dorsiflexion.  Imaging: XR Foot 2 Views Left Result Date: 05/04/2023 2 view x-ray of the right and left foot.  AP and lateral.  No significant signs of degenerative changes and no noted fractures.  Lateral view of the left foot does show a small enthesophyte coming off the Achilles tendon posteriorly as well as a calcaneal spur inferiorly, inferior calcaneal spur on the left side does appear to be more significant in size.  Lateral view of the right foot shows similar findings with enthesophyte at the posterior aspect and early formation as well as a calcaneal spur.  XR Foot 2 Views Right Result Date: 05/04/2023 2 view x-ray of the right and left foot.  AP and lateral.  No significant signs of degenerative changes and no noted fractures.  Lateral view of the left foot does show a small enthesophyte coming off the Achilles tendon posteriorly as well as a calcaneal spur inferiorly, inferior calcaneal spur on the left side does appear to be more significant in size.  Lateral view of the right foot shows similar findings with enthesophyte at the posterior aspect and early formation as well as a calcaneal spur.    PMFS History: Patient Active Problem List   Diagnosis Date Noted   Dyslipidemia 11/13/2020   Abdominal pain, epigastric 05/17/2016   Atypical chest pain 05/17/2016   Great toe pain 03/10/2015   Right low back pain 01/08/2015   Acute left flank pain 07/22/2014   RUQ abdominal pain 01/06/2010   HEARTBURN 08/14/2009   Anxiety state 03/27/2009   PAROXYSMAL TACHYCARDIA 03/27/2009   FRACTURE, TIBIAL PLATEAU 09/21/2008   ANEMIA-IRON DEFICIENCY 04/17/2008   GERD 04/17/2008   Obesity 12/04/2006   Obstructive sleep apnea (adult) (pediatric) 12/04/2006   TEMPOROMANDIBULAR JOINT PAIN 12/04/2006    Migraine 10/05/2006   Past Medical History:  Diagnosis Date   Anemia    iron deficiency   Arthritis    Asthma    as a child    Family history of adverse reaction to anesthesia    mother gets n/v with anesthesia    GERD (gastroesophageal reflux disease)    Headache(784.0)    Heart murmur    as a baby    History of kidney stones    Morbid obesity (HCC)    Palpitations    Stress fracture    Medial tibial plateau; mild medial retinacular sprain and PF lig sprain    Thyroid disease     Family History  Problem Relation Age of Onset   Hypertension Other        family hx    Colon polyps Father    Breast cancer Neg Hx    Ovarian cancer Neg Hx    Colon cancer Neg Hx     Past Surgical History:  Procedure Laterality Date  ENDOVENOUS ABLATION SAPHENOUS VEIN W/ LASER Right    LAPAROSCOPIC CHOLECYSTECTOMY SINGLE SITE WITH INTRAOPERATIVE CHOLANGIOGRAM N/A 07/05/2016   Procedure: LAPAROSCOPIC CHOLECYSTECTOMY  WITH INTRAOPERATIVE CHOLANGIOGRAM;  Surgeon: Jacolyn Matar, MD;  Location: [facility];  Service: General;  Laterality: N/A;   tubes in ears     multiple    Social History   Occupational History   Not on file  Tobacco Use   Smoking status: Never   Smokeless tobacco: Never  Vaping Use   Vaping status: Never Used  Substance and Sexual Activity   Alcohol use: No    Alcohol/week: 0.0 standard drinks of alcohol   Drug use: No   Sexual activity: Yes    Birth control/protection: Pill

## 2023-05-03 NOTE — Progress Notes (Unsigned)
 Patient says that she is feeling a bit better after her first treatment of shockwave. She says that she was a bit sore for a few hours after the shockwave, but by the next day she felt fine. She did notice the next day that she was able to get through the workday a little better with less pain.   Patient was instructed in 10 minutes of therapeutic exercises for feet to improve strength, ROM and function according to my instructions and plan of care by a Certified Athletic Trainer during the office visit. A customized handout was provided and demonstration of proper technique shown and discussed. Patient did perform exercises and demonstrate understanding through teachback.  All questions discussed and answered.

## 2023-05-04 ENCOUNTER — Encounter: Payer: Self-pay | Admitting: Sports Medicine

## 2023-05-10 ENCOUNTER — Ambulatory Visit: Admitting: Sports Medicine

## 2023-05-10 ENCOUNTER — Encounter: Payer: Self-pay | Admitting: Sports Medicine

## 2023-05-10 DIAGNOSIS — M7732 Calcaneal spur, left foot: Secondary | ICD-10-CM

## 2023-05-10 DIAGNOSIS — M722 Plantar fascial fibromatosis: Secondary | ICD-10-CM

## 2023-05-10 DIAGNOSIS — M7731 Calcaneal spur, right foot: Secondary | ICD-10-CM | POA: Diagnosis not present

## 2023-05-10 DIAGNOSIS — M79672 Pain in left foot: Secondary | ICD-10-CM | POA: Diagnosis not present

## 2023-05-10 DIAGNOSIS — M79671 Pain in right foot: Secondary | ICD-10-CM | POA: Diagnosis not present

## 2023-05-10 MED ORDER — MELOXICAM 15 MG PO TABS: 15.0000 mg | ORAL_TABLET | Freq: Every day | ORAL | 0 refills | Status: DC

## 2023-05-10 NOTE — Progress Notes (Signed)
 [name] - 38 y.o. female MRN [id number]  Date of birth: [birth year]  Office Visit Note: Visit Date: 05/10/2023 PCP: Pcp, No Referred by: No ref. provider found  Subjective: Chief Complaint  Patient presents with   Right Foot - Follow-up   Left Foot - Follow-up   HPI: Amber [name] is a pleasant 38 y.o. female who presents today for chronic bilateral heel pain with a degree of plantar fascia syndrome as well as calcaneal heel spurs.  She was doing well and finding benefit from both treatments of extracorporeal shockwave therapy, however she wore her flip-flops for about 4 days in a row and feels like this really exacerbated her pain.  Continues to be painful right over the plantar aspect of her heels.  This does not radiate into the arch of the foot.  Pertinent ROS were reviewed with the patient and found to be negative unless otherwise specified above in HPI.   Assessment & Plan: Visit Diagnoses:  1. Heel pain, bilateral   2. Calcaneal spur of both feet   3. Plantar fascia syndrome    Plan: Impression is chronic bilateral plantar heel pain which is a combination of symptomatic calcaneal heel spur as well as plantar fascia syndrome.  She did receive good initial relief from shockwave therapy but had a setback getting multiday use of flip-flops without foot support.  We did repeat extracorporeal shockwave therapy today.  I did provide her a new 5/16 inch heel lift to help offload pressure from the calcaneus/heel spur.  I would like to start her on a short course of meloxicam  15 mg to take once daily for 2 weeks.  She will send me a message in 1 week to see her degree of improvement, if she is at least 25-30% improved from prior treatment we can consider a few additional shockwave treatments vs. Other treatment modalities.  Follow-up: Return for will send me a message/update x 1 week.   Meds & Orders:  Meds ordered this encounter  Medications   meloxicam  (MOBIC ) 15 MG tablet     Sig: Take 1 tablet (15 mg total) by mouth daily.    Dispense:  21 tablet    Refill:  0   No orders of the defined types were placed in this encounter.    Procedures: Procedure: ECSWT Indications:  Plantar fascia syndrome, calcaneal heel spur   Procedure Details Consent: Risks of procedure as well as the alternatives and risks of each were explained to the patient.  Verbal consent for procedure obtained. Time Out: Verified patient identification, verified procedure, site was marked, verified correct patient position. The area was cleaned with alcohol swab.     The right and left plantar heel/spur and plantar fascia was targeted for Extracorporeal shockwave therapy.    Preset: Heel spur Power Level: 110 mJ Frequency: 12-13 Hz Impulse/cycles: 2400 (each foot) Head size: Regular   Patient tolerated procedure well without immediate complications      Clinical History: No specialty comments available.  She reports that she has never smoked. She has never used smokeless tobacco.  Recent Labs    03/06/23 0916  HGBA1C 5.7*    Objective:    Physical Exam  Gen: Well-appearing, in no acute distress; non-toxic CV: Well-perfused. Warm.  Resp: Breathing unlabored on room air; no wheezing. Psych: Fluid speech in conversation; appropriate affect; normal thought process  Ortho Exam - Bilateral feet: + TTP over the plantar aspect of the mid calcaneus near the region of  her heel spur.  There are some tenderness over the generalized plantar fascia but not near the insertion over the medial band.  No redness swelling or effusion.  Imaging: No results found.  Past Medical/Family/Surgical/Social History: Medications & Allergies reviewed per EMR, new medications updated. Patient Active Problem List   Diagnosis Date Noted   Dyslipidemia 11/13/2020   Abdominal pain, epigastric 05/17/2016   Atypical chest pain 05/17/2016   Great toe pain 03/10/2015   Right low back pain 01/08/2015    Acute left flank pain 07/22/2014   RUQ abdominal pain 01/06/2010   HEARTBURN 08/14/2009   Anxiety state 03/27/2009   PAROXYSMAL TACHYCARDIA 03/27/2009   FRACTURE, TIBIAL PLATEAU 09/21/2008   ANEMIA-IRON DEFICIENCY 04/17/2008   GERD 04/17/2008   Obesity 12/04/2006   Obstructive sleep apnea (adult) (pediatric) 12/04/2006   TEMPOROMANDIBULAR JOINT PAIN 12/04/2006   Migraine 10/05/2006   Past Medical History:  Diagnosis Date   Anemia    iron deficiency   Arthritis    Asthma    as a child    Family history of adverse reaction to anesthesia    mother gets n/v with anesthesia    GERD (gastroesophageal reflux disease)    Headache(784.0)    Heart murmur    as a baby    History of kidney stones    Morbid obesity (HCC)    Palpitations    Stress fracture    Medial tibial plateau; mild medial retinacular sprain and PF lig sprain    Thyroid  disease    Family History  Problem Relation Age of Onset   Hypertension Other        family hx    Colon polyps Father    Breast cancer Neg Hx    Ovarian cancer Neg Hx    Colon cancer Neg Hx    Past Surgical History:  Procedure Laterality Date   ENDOVENOUS ABLATION SAPHENOUS VEIN W/ LASER Right    LAPAROSCOPIC CHOLECYSTECTOMY SINGLE SITE WITH INTRAOPERATIVE CHOLANGIOGRAM N/A 07/05/2016   Procedure: LAPAROSCOPIC CHOLECYSTECTOMY  WITH INTRAOPERATIVE CHOLANGIOGRAM;  Surgeon: Jacolyn Matar, MD;  Location: [facility];  Service: General;  Laterality: N/A;   tubes in ears     multiple    Social History   Occupational History   Not on file  Tobacco Use   Smoking status: Never   Smokeless tobacco: Never  Vaping Use   Vaping status: Never Used  Substance and Sexual Activity   Alcohol use: No    Alcohol/week: 0.0 standard drinks of alcohol   Drug use: No   Sexual activity: Yes    Birth control/protection: Pill

## 2023-05-10 NOTE — Progress Notes (Signed)
 Patient says that she was doing well, but she wore flip flops Thursday-Sunday and now feels like she is back to where she was prior to the first shockwave therapy. She says that her pain is in the same location and does feel the same as it initially did, no new symptoms.

## 2023-05-17 ENCOUNTER — Encounter: Payer: Self-pay | Admitting: Sports Medicine

## 2023-05-17 ENCOUNTER — Other Ambulatory Visit: Payer: Self-pay

## 2023-05-17 DIAGNOSIS — E038 Other specified hypothyroidism: Secondary | ICD-10-CM

## 2023-05-17 NOTE — Progress Notes (Signed)
 TSH order placed per 04/19/23 My chart message to patient: Amber [name], CNM to [facility]      04/19/23  2:40 PM Hi Amber [name], Apologies for my delayed response I was out of town at a conference, I've just sent in the and we should recheck next month. Camilo Cella  Last read by Dempsey Finely at 9:27AM on 05/14/2023. TSH

## 2023-05-18 ENCOUNTER — Other Ambulatory Visit: Payer: Self-pay | Admitting: Certified Nurse Midwife

## 2023-05-18 ENCOUNTER — Other Ambulatory Visit

## 2023-05-18 DIAGNOSIS — E038 Other specified hypothyroidism: Secondary | ICD-10-CM

## 2023-05-19 ENCOUNTER — Encounter: Payer: Self-pay | Admitting: Certified Nurse Midwife

## 2023-05-19 ENCOUNTER — Other Ambulatory Visit: Payer: Self-pay | Admitting: Certified Nurse Midwife

## 2023-05-19 LAB — TSH: TSH: 2.11 u[IU]/mL (ref 0.450–4.500)

## 2023-05-19 MED ORDER — LEVOTHYROXINE SODIUM 88 MCG PO TABS: 88.0000 ug | ORAL_TABLET | Freq: Every day | ORAL | 1 refills | Status: AC

## 2023-05-19 NOTE — Progress Notes (Signed)
 TSH 2.11, continue , recheck in 6 months.

## 2023-05-29 ENCOUNTER — Ambulatory Visit (INDEPENDENT_AMBULATORY_CARE_PROVIDER_SITE_OTHER): Admitting: Orthopedic Surgery

## 2023-05-29 ENCOUNTER — Encounter: Payer: Self-pay | Admitting: Orthopedic Surgery

## 2023-05-29 ENCOUNTER — Other Ambulatory Visit: Payer: Self-pay | Admitting: Certified Nurse Midwife

## 2023-05-29 DIAGNOSIS — M79672 Pain in left foot: Secondary | ICD-10-CM | POA: Diagnosis not present

## 2023-05-29 DIAGNOSIS — M722 Plantar fascial fibromatosis: Secondary | ICD-10-CM | POA: Diagnosis not present

## 2023-05-29 DIAGNOSIS — M6702 Short Achilles tendon (acquired), left ankle: Secondary | ICD-10-CM

## 2023-05-29 DIAGNOSIS — M6701 Short Achilles tendon (acquired), right ankle: Secondary | ICD-10-CM

## 2023-05-29 DIAGNOSIS — M79671 Pain in right foot: Secondary | ICD-10-CM | POA: Diagnosis not present

## 2023-05-29 NOTE — Progress Notes (Signed)
 Office Visit Note   Patient: Amber [name]           Date of Birth: 1985-11-23           MRN: [id number] Visit Date: 05/29/2023              Requested by: No referring provider defined for this encounter. PCP: Pcp, No  Chief Complaint  Patient presents with   Right Foot - Follow-up   Left Foot - Follow-up      HPI: Patient is a 38 year old woman with bilateral plantar fasciitis.  Patient has had shockwave therapy with Dr. Vaughn Georges x 3.  Patient has also had steroid injections without relief.  Patient has pain with standing pain with weightbearing.  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  Pain is worse on the right greater than the left.  Assessment & Plan: Visit Diagnoses:  1. Heel pain, bilateral   2. Plantar fasciitis, bilateral   3. Contracture of both Achilles tendons     Plan: Patient was given instructions and demonstrated Achilles stretching.  Reevaluate in 2 months.  Follow-Up Instructions: Return in about 2 months (around 07/29/2023).   Ortho Exam  Patient is alert, oriented, no adenopathy, well-dressed, normal affect, normal respiratory effort. Examination patient has dorsiflexion to neutral with the knee extended.  Lateral compression of the calcaneus is minimally tender the tarsal tunnel is nontender to palpation.  Patient is point tender to palpation the origin of the plantar fascia medially worse on the right than the left.  Imaging: No results found. No images are attached to the encounter.  Labs: Lab Results  Component Value Date   HGBA1C 5.7 (H) 03/06/2023   HGBA1C 5.3 04/03/2020   HGBA1C 5.5 03/29/2019   LABURIC 5.3 03/10/2015     Lab Results  Component Value Date   ALBUMIN 4.3 03/06/2023   ALBUMIN 4.2 03/29/2019   ALBUMIN 4.2 02/02/2018    No results found for: "MG" Lab Results  Component Value Date   VD25OH 27.2 (L) 03/06/2023    No results found for: "PREALBUMIN"    Latest Ref Rng & Units 03/06/2023    9:16 AM  03/29/2019    8:59 AM 06/30/2016    8:09 AM  CBC EXTENDED  WBC 3.4 - 10.8 x10E3/uL 9.5  7.8  7.3   RBC 3.77 - 5.28 x10E6/uL 4.30  4.28  4.20   Hemoglobin 11.1 - 15.9 g/dL 08.6  57.8  46.9   HCT 34.0 - 46.6 % 39.9  38.4  37.0   Platelets 150 - 450 x10E3/uL 404  331  287      There is no height or weight on file to calculate BMI.  Orders:  No orders of the defined types were placed in this encounter.  No orders of the defined types were placed in this encounter.    Procedures: No procedures performed  Clinical Data: No additional findings.  ROS:  All other systems negative, except as noted in the HPI. Review of Systems  Objective: Vital Signs: There were no vitals taken for this visit.  Specialty Comments:  No specialty comments available.  PMFS History: Patient Active Problem List   Diagnosis Date Noted   Dyslipidemia 11/13/2020   Abdominal pain, epigastric 05/17/2016   Atypical chest pain 05/17/2016   Great toe pain 03/10/2015   Right low back pain 01/08/2015   Acute left flank pain 07/22/2014   RUQ abdominal pain 01/06/2010   HEARTBURN 08/14/2009  Anxiety state 03/27/2009   PAROXYSMAL TACHYCARDIA 03/27/2009   FRACTURE, TIBIAL PLATEAU 09/21/2008   ANEMIA-IRON DEFICIENCY 04/17/2008   GERD 04/17/2008   Obesity 12/04/2006   Obstructive sleep apnea (adult) (pediatric) 12/04/2006   TEMPOROMANDIBULAR JOINT PAIN 12/04/2006   Migraine 10/05/2006   Past Medical History:  Diagnosis Date   Anemia    iron deficiency   Arthritis    Asthma    as a child    Family history of adverse reaction to anesthesia    mother gets n/v with anesthesia    GERD (gastroesophageal reflux disease)    Headache(784.0)    Heart murmur    as a baby    History of kidney stones    Morbid obesity (HCC)    Palpitations    Stress fracture    Medial tibial plateau; mild medial retinacular sprain and PF lig sprain    Thyroid  disease     Family History  Problem Relation Age of Onset    Hypertension Other        family hx    Colon polyps Father    Breast cancer Neg Hx    Ovarian cancer Neg Hx    Colon cancer Neg Hx     Past Surgical History:  Procedure Laterality Date   ENDOVENOUS ABLATION SAPHENOUS VEIN W/ LASER Right    LAPAROSCOPIC CHOLECYSTECTOMY SINGLE SITE WITH INTRAOPERATIVE CHOLANGIOGRAM N/A 07/05/2016   Procedure: LAPAROSCOPIC CHOLECYSTECTOMY  WITH INTRAOPERATIVE CHOLANGIOGRAM;  Surgeon: Jacolyn Matar, MD;  Location: [facility];  Service: General;  Laterality: N/A;   tubes in ears     multiple    Social History   Occupational History   Not on file  Tobacco Use   Smoking status: Never   Smokeless tobacco: Never  Vaping Use   Vaping status: Never Used  Substance and Sexual Activity   Alcohol use: No    Alcohol/week: 0.0 standard drinks of alcohol   Drug use: No   Sexual activity: Yes    Birth control/protection: Pill

## 2023-06-30 ENCOUNTER — Ambulatory Visit (INDEPENDENT_AMBULATORY_CARE_PROVIDER_SITE_OTHER): Admitting: Neurology

## 2023-06-30 ENCOUNTER — Encounter: Payer: Self-pay | Admitting: Neurology

## 2023-06-30 VITALS — BP 135/84 | HR 71 | Ht 69.0 in | Wt 304.0 lb

## 2023-06-30 DIAGNOSIS — R51 Headache with orthostatic component, not elsewhere classified: Secondary | ICD-10-CM | POA: Diagnosis not present

## 2023-06-30 DIAGNOSIS — G43101 Migraine with aura, not intractable, with status migrainosus: Secondary | ICD-10-CM

## 2023-06-30 DIAGNOSIS — G43109 Migraine with aura, not intractable, without status migrainosus: Secondary | ICD-10-CM

## 2023-06-30 DIAGNOSIS — H539 Unspecified visual disturbance: Secondary | ICD-10-CM

## 2023-06-30 DIAGNOSIS — G43709 Chronic migraine without aura, not intractable, without status migrainosus: Secondary | ICD-10-CM | POA: Diagnosis not present

## 2023-06-30 DIAGNOSIS — R519 Headache, unspecified: Secondary | ICD-10-CM

## 2023-06-30 DIAGNOSIS — G4730 Sleep apnea, unspecified: Secondary | ICD-10-CM

## 2023-06-30 MED ORDER — ELETRIPTAN HYDROBROMIDE 40 MG PO TABS: 40.0000 mg | ORAL_TABLET | ORAL | 11 refills | Status: AC | PRN

## 2023-06-30 MED ORDER — PROPRANOLOL HCL 10 MG PO TABS: 10.0000 mg | ORAL_TABLET | Freq: Two times a day (BID) | ORAL | 6 refills | Status: DC

## 2023-06-30 NOTE — Progress Notes (Unsigned)
 Amber [name]    Provider:  Dr Tresia Fruit Requesting Provider: Forestine Igo, CNM Primary Care Provider:  Pcp, No  CC:  worsening headaches  HPI:  Amber [name] is a 38 y.o. female here as requested by Forestine Igo, CNM for headaches. has Obesity; ANEMIA-IRON DEFICIENCY; Anxiety state; Obstructive sleep apnea (adult) (pediatric); Migraine; PAROXYSMAL TACHYCARDIA; TEMPOROMANDIBULAR JOINT PAIN; GERD; HEARTBURN; FRACTURE, TIBIAL PLATEAU; RUQ abdominal pain; Acute left flank pain; Right low back pain; Great toe pain; Abdominal pain, epigastric; Atypical chest pain; and Dyslipidemia on their problem list.   Started at the age of 36, mother with migraines. In her late teens the migraines improved somewhat she still had headaches but not migraines. Over the last year, migraines are worsening and the last few months more frequent she has a headache every day. She has ahx of sleep apnea, not using cpap, is very tired, could nod off a lot during the day, weight gain over the years. The headache is there as soon as the eyes open, all right-sided, pulsating/pounding/throbbing, light and sound sensitivity, nausea, no recent vomiting, hurts to move, a dark quiet room helps.She snores very badly. Morning headaches, worse supine and in the morining, also nocturnal Excessive daytime somnolence. headaches, she will see wavy lines sometimes not often prior to the migraine.She is having more blurry vision.Daily headaches. Migraines are moderate to severe and >10 migraine days a month. Daily ibuprofen  for migraines and back pain/foo pain  Reviewed notes, labs and imaging from outside physicians, which showed:  From a thorough review of records and patient report, Medications tried that can be used in migraine/headache management greater than 3 months include: Lifestyle modification, headache diaries, better sleep hygiene, exercise, management of migraine triggers, OTC and prescribed  analgesics/nsaids such as ibuprofen , excedrin, alleve and others, Flexeril , Voltaren  tabs, gabapentin , meloxicam , methocarbamol , Reglan , Zofran , prednisone , Imitrex , topiramate (made the migraines worse), amitriptyline, propranolol considered in the past but given blood pressure usually 120/80 normal and risks of hypotension, imitrex , zolmitriptan, on propranolol(Rizatriptan is contraindicated with propranolol)  05/2010:  MEDICAL RECORD NO.: [id number] PATIENT TYPE: OUT  LOCATION: SLEEP CENTER FACILITY: [facility]  PHYSICIAN: Roma Close, M.D. [facility] DATE OF BIRTH: 1985/03/21  DATE OF STUDY: 12/03/2003  NOCTURNAL POLYSOMNOGRAM  REFERRING PHYSICIAN: Dr. Terrye Fiedler  INDICATION FOR STUDY: Hypersomnia with sleep apnea.  EPWORTH SLEEPINESS SCORE: 16.  SLEEP ARCHITECTURE: The patient had a total sleep time of 347 minutes with a sleep efficiency of 91%. There was decreased REM and slow age sleep was never achieved. Sleep onset latency was normal and REM latency was prolonged.    IMPRESSION:  1.  Mild obstructive sleep apnea/hypopnea syndrome with a respiratory      disturbance index of 17 events per hour and desaturation as low at 87%.      The events primarily occurred in the supine position.  2. Moderate      snoring noted throughout the study.  3. No clinically significant      cardiac arrhythmias or periodic leg movements.  I reviewed Camilo Cella Albrecht's notes from March were 2025, headaches continued despite stopping Topamax  6 days ago, pain starts in the right side of the head neck below and behind the ear continues up to forehead, patient reports associated light sensitivity but feels is due to the pain, denies visual changes, hearing changes, changes to grip strength or sensation in arms or hands, methocarbamol  and ibuprofen  were tried given the pain that started in the neck, Imitrex  did not provide relief  MRI  brain 04/2007 personally reviewed images and agree with report,  05/18/2023: TSH  2.110 normal 03/06/2023 vitamin d  27.2, decreased, hgba1c 5.7 pre-diabetes range     Latest Ref Rng & Units 03/06/2023    9:16 AM 03/29/2019    8:59 AM 06/30/2016    8:09 AM  CBC  WBC 3.4 - 10.8 x10E3/uL 9.5  7.8  7.3   Hemoglobin 11.1 - 15.9 g/dL 28.4  13.2  44.0   Hematocrit 34.0 - 46.6 % 39.9  38.4  37.0   Platelets 150 - 450 x10E3/uL 404  331  287       Latest Ref Rng & Units 03/06/2023    9:16 AM 03/29/2019    8:59 AM 02/02/2018    8:37 AM  CMP  Glucose 70 - 99 mg/dL 86  75  88   BUN 6 - 20 mg/dL 16  10  12    Creatinine 0.57 - 1.00 mg/dL 1.02  7.25  3.66   Sodium 134 - 144 mmol/L 137  139  136   Potassium 3.5 - 5.2 mmol/L 4.6  4.4  4.3   Chloride 96 - 106 mmol/L 100  104  101   CO2 20 - 29 mmol/L 24  22  23    Calcium 8.7 - 10.2 mg/dL 9.5  9.3  9.3   Total Protein 6.0 - 8.5 g/dL 6.8  6.9  6.7   Total Bilirubin 0.0 - 1.2 mg/dL 0.3  0.4  0.3   Alkaline Phos 44 - 121 IU/L 74  50  58   AST 0 - 40 IU/L 12  16  11    ALT 0 - 32 IU/L 18  17  16       Narrative  Clinical Data: Sent for many years with associated nausea.   MRI HEAD WITHOUT CONTRAST   Technique:  Multiplanar, multiecho pulse sequences of the brain and surrounding structures were obtained according to standard protocol without intravenous contrast.   Comparison: None available   Findings: Gray-white matter differentiation is normal for patient age.  The sella is appropriate in signal morphology.  Clival signal is intact.  Cerebellar tonsils are positioned proximally 2.5 ml below the level of the foremen magnum.  The composite renal space and pre and soft tissues also within normal limits.   No diffusion weighted abnormalities seen.  Axial FLAIR and T2- weighted images demonstrate the brain signal to be normal. Ventricles are normal. Mastoids  are clear.  The internal auditory canals are symmetrical in signal and  morphology Flow voids are maintained in the major vessels at the skull base.   There is mild  thickening of the mucosa of the ethmoids.  The visualized orbital contents are grossly normal.   No abnormal blood breakdown products seen, on the SPGR sequences   MPRESSION:   1.  No acute ischemia. 2.  Brain signal normal. 3.  Mild mucosal thickening of the ethmoid air cells.    Review of Systems: Patient complains of symptoms per HPI as well as the following symptoms none. Pertinent negatives and positives per HPI. All others negative.   Social History   Socioeconomic History   Marital status: Married    Spouse name: Not on file   Number of children: 1   Years of education: Not on file   Highest education level: Not on file  Occupational History   Not on file  Tobacco Use   Smoking status: Never   Smokeless tobacco: Never  Vaping Use   Vaping  status: Never Used  Substance and Sexual Activity   Alcohol use: No    Alcohol/week: 0.0 standard drinks of alcohol   Drug use: No   Sexual activity: Yes    Birth control/protection: Pill  Other Topics Concern   Not on file  Social History Narrative   Married; works as a Pharmacologist at [employer], [employer].    Social Drivers of Corporate investment banker Strain: Low Risk  (02/15/2021)   Received from [facility]   Overall Financial Resource Strain (CARDIA)    Difficulty of Paying Living Expenses: Not hard at all  Food Insecurity: No Food Insecurity (02/15/2021)   Received from [facility]   Hunger Vital Sign    Within the past 12 months, you worried that your food would run out before you got the money to buy more.: Never true    Within the past 12 months, the food you bought just didn't last and you didn't have money to get more.: Never true  Transportation Needs: No Transportation Needs (02/15/2021)   Received from [facility] - Transportation    Lack of Transportation (Medical): No    Lack of Transportation (Non-Medical): No  Physical Activity: Insufficiently Active (02/15/2021)   Received from  [facility]   Exercise Vital Sign    On average, how many days per week do you engage in moderate to strenuous exercise (like a brisk walk)?: 2 days    On average, how many minutes do you engage in exercise at this level?: 20 min  Stress: Not on file  Social Connections: Not on file  Intimate Partner Violence: Not on file    Family History  Problem Relation Age of Onset   Hypertension Mother    Hyperlipidemia Mother    Migraines Mother    Colon polyps Father    Cushing syndrome Father    Murrell Arrant' disease Brother    Hypertension Other        family hx    Breast cancer Neg Hx    Ovarian cancer Neg Hx    Colon cancer Neg Hx     Past Medical History:  Diagnosis Date   Anemia    iron deficiency   Arthritis    Asthma    as a child    Family history of adverse reaction to anesthesia    mother gets n/v with anesthesia    GERD (gastroesophageal reflux disease)    Headache(784.0)    Heart murmur    as a baby    History of kidney stones    Morbid obesity (HCC)    Palpitations    Stress fracture    Medial tibial plateau; mild medial retinacular sprain and PF lig sprain    Thyroid  disease     Patient Active Problem List   Diagnosis Date Noted   Dyslipidemia 11/13/2020   Abdominal pain, epigastric 05/17/2016   Atypical chest pain 05/17/2016   Great toe pain 03/10/2015   Right low back pain 01/08/2015   Acute left flank pain 07/22/2014   RUQ abdominal pain 01/06/2010   HEARTBURN 08/14/2009   Anxiety state 03/27/2009   PAROXYSMAL TACHYCARDIA 03/27/2009   FRACTURE, TIBIAL PLATEAU 09/21/2008   ANEMIA-IRON DEFICIENCY 04/17/2008   GERD 04/17/2008   Obesity 12/04/2006   Obstructive sleep apnea (adult) (pediatric) 12/04/2006   TEMPOROMANDIBULAR JOINT PAIN 12/04/2006   Migraine 10/05/2006    Past Surgical History:  Procedure Laterality Date   ENDOVENOUS ABLATION SAPHENOUS VEIN  W/ LASER Right    LAPAROSCOPIC CHOLECYSTECTOMY SINGLE SITE WITH INTRAOPERATIVE CHOLANGIOGRAM  N/A 07/05/2016   Procedure: LAPAROSCOPIC CHOLECYSTECTOMY  WITH INTRAOPERATIVE CHOLANGIOGRAM;  Surgeon: Jacolyn Matar, MD;  Location: [facility];  Service: General;  Laterality: N/A;   tubes in ears     multiple     Current Outpatient Medications  Medication Sig Dispense Refill   cetirizine (ZYRTEC) 10 MG tablet Take 10 mg by mouth daily.     cholecalciferol (VITAMIN D3) 25 MCG (1000 UNIT) tablet Take 1,000 Units by mouth daily.     eletriptan (RELPAX) 40 MG tablet Take 1 tablet (40 mg total) by mouth as needed for migraine or headache. May repeat in 2 hours if headache persists or recurs. 9 tablet 11   esomeprazole (NEXIUM) 40 MG capsule Take 40 mg by mouth 2 (two) times daily before a meal.     ibuprofen  (ADVIL ) 800 MG tablet Take 1 tablet (800 mg total) by mouth every 8 (eight) hours as needed for mild pain (pain score 1-3). 60 tablet 1   levonorgestrel-ethinyl estradiol (SEASONALE) 0.15-0.03 MG tablet Take 1 tablet by mouth daily. 91 tablet 4   levothyroxine  (SYNTHROID ) 88 MCG tablet Take 1 tablet (88 mcg total) by mouth daily before breakfast. 90 tablet 1   montelukast  (SINGULAIR ) 10 MG tablet TAKE ONE TABLET AT BEDTIME 90 tablet 0   Multiple Vitamins-Minerals (MULTIVITAMIN WITH MINERALS) tablet Take 1 tablet by mouth daily.     propranolol (INDERAL) 10 MG tablet Take 1 tablet (10 mg total) by mouth 2 (two) times daily. 60 tablet 6   vitamin B-12 (CYANOCOBALAMIN ) 500 MCG tablet Take 500 mcg by mouth daily.     No current facility-administered medications for this visit.    Allergies as of 06/30/2023 - Review Complete 06/30/2023  Allergen Reaction Noted   Hydromorphone Other (See Comments) 09/05/2018   Dilaudid [hydromorphone hcl] Other (See Comments) 10/23/2014   Latex Rash 10/23/2014    Vitals: BP 135/84   Pulse 71   Ht 5' 9 (1.753 m)   Wt (!) 304 lb (137.9 kg)   BMI 44.89 kg/m  Last Weight:  Wt Readings from Last 1 Encounters:  06/30/23 (!) 304 lb (137.9 kg)   Last  Height:   Ht Readings from Last 1 Encounters:  06/30/23 5' 9 (1.753 m)     Physical exam: Exam: Gen: NAD, conversant, well nourised, obese, well groomed                     CV: RRR, +SEM. No Carotid Bruits. No peripheral edema, warm, nontender Eyes: Conjunctivae clear without exudates or hemorrhage  Neuro: Detailed Neurologic Exam  Speech:    Speech is normal; fluent and spontaneous with normal comprehension.  Cognition:    The patient is oriented to person, place, and time;     recent and remote memory intact;     language fluent;     normal attention, concentration,     fund of knowledge Cranial Nerves:    The pupils are equal, round, and reactive to light. The fundi are normal and spontaneous venous pulsations are present. Visual fields are full to finger confrontation. Extraocular movements are intact. Trigeminal sensation is intact and the muscles of mastication are normal. The face is symmetric. The palate elevates in the midline. Hearing intact. Voice is normal. Shoulder shrug is normal. The tongue has normal motion without fasciculations.   Coordination: nml  Gait: nml  Motor Observation:    No asymmetry,  no atrophy, and no involuntary movements noted. Tone:    Normal muscle tone.    Posture:    Posture is normal. normal erect    Strength:    Strength is V/V in the upper and lower limbs.      Sensation: intact to LT     Reflex Exam:  DTR's:    Deep tendon reflexes in the upper and lower extremities are symmetrical bilaterally.   Toes:    The toes are downgoing bilaterally.   Clonus:    Clonus is absent.    Assessment/Plan:  Patient with worsening headaches, sleep apnea diagnosed in the past  Sleep apnea and worsening morning headache and fatigue: refer to sleep team (ESS 10), AHI in the past 17:Sleep apnea, worsening morning headaches, weight gain, tested in 2012 as below, ESS 10, FSS 48  IMPRESSION:Mild obstructive sleep apnea/hypopnea syndrome with  a respiratory      disturbance index of 17 events per hour and desaturation as low at 87%.      The events primarily occurred in the supine position.  2. Moderate      snoring noted throughout the study.  3. No clinically significant      cardiac arrhythmias or periodic leg movements.  2. MRI of the brain w/wo contrast: MRI brain due to concerning symptoms of morning headaches, positional headaches,vision changes, worsening headaches  to look for space occupying mass, chiari or intracranial hypertension (pseudotumor), strokes, malignancies, vasculidities, demyelination(multiple sclerosis) or other  3. Prevention: Propranolol or we can use the new medications Emgality/Ajovy which are once a month injections. Try propranolol, then switch to the newer medications. Acute management: Relpax (eletriptan) and then consider Ubrelvy or Nurtec (Rizatriptan is contraindicated with propranolol)  4. Discussed: Talk to obgyn There is increased risk for stroke in women with migraine with aura and a contraindication for the combined contraceptive pill for use by women who have migraine with aura. The risk for women with migraine without aura is lower. However other risk factors like smoking are far more likely to increase stroke risk than migraine. There is a recommendation for no smoking and for the use of OCPs without estrogen such as progestogen only pills particularly for women with migraine with aura.Aaron Aas People who have migraine headaches with auras may be 3 times more likely to have a stroke caused by a blood clot, compared to migraine patients who don't see auras. Women who take hormone-replacement therapy may be 30 percent more likely to suffer a clot-based stroke than women not taking medication containing estrogen. Other risk factors like smoking and high blood pressure may be  much more important. And stroke is still a rare complication due to migraine aura and is controversial and lower doses may not cause a  risk.  5. Discussed: Analgesic Rebound Headache   Orders Placed This Encounter  Procedures   MR BRAIN W WO CONTRAST   Ambulatory referral to Sleep Studies   Meds ordered this encounter  Medications   propranolol (INDERAL) 10 MG tablet    Sig: Take 1 tablet (10 mg total) by mouth 2 (two) times daily.    Dispense:  60 tablet    Refill:  6   eletriptan (RELPAX) 40 MG tablet    Sig: Take 1 tablet (40 mg total) by mouth as needed for migraine or headache. May repeat in 2 hours if headache persists or recurs.    Dispense:  9 tablet    Refill:  11    Also spent  time education of migraines and migraine management, medication choices, acute vs prevention, strategies as well as below To prevent or relieve headaches, try the following: Cool Compress. Lie down and place a cool compress on your head.  Avoid headache triggers. If certain foods or odors seem to have triggered your migraines in the past, avoid them. A headache diary might help you identify triggers.  Include physical activity in your daily routine. Try a daily walk or other moderate aerobic exercise.  Manage stress. Find healthy ways to cope with the stressors, such as delegating tasks on your to-do list.  Practice relaxation techniques. Try deep breathing, yoga, massage and visualization.  Eat regularly. Eating regularly scheduled meals and maintaining a healthy diet might help prevent headaches. Also, drink plenty of fluids.  Follow a regular sleep schedule. Sleep deprivation might contribute to headaches Consider biofeedback. With this mind-body technique, you learn to control certain bodily functions -- such as muscle tension, heart rate and blood pressure -- to prevent headaches or reduce headache pain.    Proceed to emergency room if you experience new or worsening symptoms or symptoms do not resolve, if you have new neurologic symptoms or if headache is severe, or for any concerning symptom.   Provided education and  documentation including informational packet on: migraines, symptoms as well as aura aura, what causes migraines, migraine triggers, chronic migraine medication overuse headache, chronic migraines, prevention of migraines both acute and preventative and the different choices and classes, nondrug treatments, behavioral and other nonpharmacologic treatments for headache.    Cc: Forestine Igo, CNM,  Pcp, No  Aldona Amel, MD  [name] [address] [place], [place] [address]  Phone [phone] Fax [phone]

## 2023-06-30 NOTE — Patient Instructions (Addendum)
 Sleep apnea and worsening morning headache and fatigue: refer to sleep team MRI of the brain w/wo contrast  Prevention: Propranolol or we can use the new medications Emgality/Ajovy which are once a month injections. Try propranolol, then switch to the newer medications. Acute management: Relpax (eletriptan) and then consider Ubrelvy or Nurtec   Discussed: Talk to obgyn There is increased risk for stroke in women with migraine with aura and a contraindication for the combined contraceptive pill for use by women who have migraine with aura. The risk for women with migraine without aura is lower. However other risk factors like smoking are far more likely to increase stroke risk than migraine. There is a recommendation for no smoking and for the use of OCPs without estrogen such as progestogen only pills particularly for women with migraine with aura.Amber [name] People who have migraine headaches with auras may be 3 times more likely to have a stroke caused by a blood clot, compared to migraine patients who don't see auras. Women who take hormone-replacement therapy may be 30 percent more likely to suffer a clot-based stroke than women not taking medication containing estrogen. Other risk factors like smoking and high blood pressure may be  much more important. And stroke is still a rare complication due to migraine aura and is controversial and lower doses may not cause a risk.  Analgesic Rebound Headache An analgesic rebound headache is a secondary disorder that is caused by the overuse of pain medicine (analgesic) to treat the original (primary) headache. It is sometimes called a medication overuse headache or a drug-induced headache. Any type of primary headache can return as a rebound headache if a person regularly takes pain medicine. The types of primary headaches that are commonly related to rebound headaches include: Migraines. Tension headaches. These are caused by tense muscles in the head and neck  area. Cluster headaches. These happen on one side of the head and around the eye. If rebound headaches continue, they can become long-term, daily headaches. What are the causes? Rebound headaches may be caused by frequent use of: Over-the-counter medicines, such as aspirin, ibuprofen , and acetaminophen . Sinus-relief medicines. Medicines that contain caffeine. Narcotic pain medicines, such as codeine and oxycodone . Some prescription migraine medicines. What are the signs or symptoms? The symptoms of a rebound headache are the same as the symptoms of the primary headache. Symptoms of specific types of headaches include: Migraine headache Pulsing or throbbing pain on one or both sides of the head. Severe pain that makes it hard to do daily activities. Pain that gets worse with physical activity. Nausea, vomiting, or both. Pain that may get worse around bright lights, loud noises, or smells. Vision changes. Numbness in one or both arms. Tension headache Pressure around the head. Dull, aching head pain. Pain felt over the front and sides of the head. Tenderness in the muscles of the head, neck, and shoulders. Cluster headache Severe pain that begins in or around one eye or temple. Droopy or swollen eyelid, or redness and tearing in the eye on the same side as the pain. One-sided head pain. Nausea. Runny nose. Sweaty, pale facial skin. Restlessness. How is this diagnosed? Rebound headaches are diagnosed by reviewing: Your medical history, including a description of your primary headaches. Your pain medicines for your primary headaches and how often you take them. How is this treated? Rebound headaches may be treated or managed by: Stopping frequent use of pain medicine. This may make your headaches worse at first, but the pain should  then become less manageable and less frequent and severe. Seeing a headache specialist. They may be able to help you manage your headaches and help  make sure there is not another cause of the headaches. Using methods of stress relief, such as acupuncture, counseling, biofeedback, and massage. Follow these instructions at home: Medicines  Take over-the-counter and prescription medicines only as told by your health care provider. Stop the repeated use of pain medicine as told by your provider. Stopping can be hard. Carefully follow instructions from your provider. Lifestyle Do not drink alcohol. Do not use any products that contain nicotine or tobacco. These products include cigarettes, chewing tobacco, and vaping devices, such as e-cigarettes. If you need help quitting, ask your provider. Get 7-9 hours of sleep each night, or the amount recommended by your provider. Find ways to manage stress, such as acupuncture, counseling, biofeedback, and massage. Exercise regularly. Exercise for at least 30 minutes, 5 times each week. General instructions Avoid things that may bring on (trigger) your primary headaches, such as certain foods. Contact a health care provider if: Medicine does not help your migraine. Your pain keeps coming back even with medicine. Get help right away if: You have new headache pain. You have headache pain that is different than what you have felt in the past. You have numbness or tingling in your arms or legs. You have changes in your speech or vision. This information is not intended to replace advice given to you by your health care provider. Make sure you discuss any questions you have with your health care provider. Document Revised: 08/30/2021 Document Reviewed: 08/30/2021 Elsevier Patient Education  2024 Elsevier Inc.    Eletriptan Tablets What is this medication? ELETRIPTAN (el ih TRIP tan) treats migraines. It works by blocking pain signals and narrowing blood vessels in the brain. It belongs to a group of medications called triptans. It is not used to prevent migraines. This medicine may be used for  other purposes; ask your health care provider or pharmacist if you have questions. COMMON BRAND NAME(S): Relpax What should I tell my care team before I take this medication? They need to know if you have any of these conditions: Circulation problems in fingers and toes Diabetes Heart disease High blood pressure High cholesterol History of irregular heartbeat History of stroke Kidney disease Liver disease Stomach or intestine problems Tobacco use An unusual or allergic reaction to eletriptan, other medications, foods, dyes, or preservatives Pregnant or trying to get pregnant Breastfeeding How should I use this medication? Take this medication by mouth with a glass of water. Take it as directed on the prescription label. Do not take it more often than directed. Keep taking it unless your care team tells you to stop. Talk to your care team about the use of this medication in children. Special care may be needed. Overdosage: If you think you have taken too much of this medicine contact a poison control center or emergency room at once. NOTE: This medicine is only for you. Do not share this medicine with others. What if I miss a dose? This does not apply. This medication is not for regular use. What may interact with this medication? Do not take this medication with any of the following: Adagrasib Ceritinib Certain antibiotics, such as clarithromycin or telithromycin Certain antivirals for HIV or hepatitis Certain medications for fungal infections, such as ketoconazole, itraconazole, or posaconazole Certain medications for migraine headache, such as almotriptan, frovatriptan, naratriptan, rizatriptan, sumatriptan , zolmitriptan Chloramphenicol Ergot alkaloids, such  as dihydroergotamine, ergonovine, ergotamine, methylergonovine Idelalisib Mifepristone Nefazodone Ribociclib This medication may also interact with the following: Certain medications for depression, anxiety, or mental  health conditions MAOIs, such as Carbex, Eldepryl, Marplan, Nardil, and Parnate This list may not describe all possible interactions. Give your health care provider a list of all the medicines, herbs, non-prescription drugs, or dietary supplements you use. Also tell them if you smoke, drink alcohol, or use illegal drugs. Some items may interact with your medicine. What should I watch for while using this medication? Visit your care team for regular checks on your progress. Tell your care team if your symptoms do not start to get better or if they get worse. This medication may affect your coordination, reaction time, or judgment. Do not drive or operate machinery until you know how this medication affects you. Sit up or stand slowly to reduce the risk of dizzy or fainting spells. Drinking alcohol with this medication can increase the risk of these side effects. Your mouth may get dry. Chewing sugarless gum or sucking hard candy and drinking plenty of water may help. Contact your care team if the problem does not go away or is severe. If you take migraine medications for 10 or more days a month, your migraines may get worse. Keep a diary of headache days and medication use. Contact your care team if your migraine attacks occur more frequently. What side effects may I notice from receiving this medication? Side effects that you should report to your care team as soon as possible: Allergic reactions--skin rash, itching, hives, swelling of the face, lips, tongue, or throat Burning, pain, tingling, or color changes in the legs or feet Heart attack--pain or tightness in the chest, shoulders, arms, or jaw, nausea, shortness of breath, cold or clammy skin, feeling faint or lightheaded Heart rhythm changes--fast or irregular heartbeat, dizziness, feeling faint or lightheaded, chest pain, trouble breathing Increase in blood pressure Irritability, confusion, fast or irregular heartbeat, muscle stiffness,  twitching muscles, sweating, high fever, seizure, chills, vomiting, diarrhea, which may be signs of serotonin syndrome Raynaud's--cool, numb, or painful fingers or toes that may change color from pale, to blue, to red Seizures Stroke--sudden numbness or weakness of the face, arm, or leg, trouble speaking, confusion, trouble walking, loss of balance or coordination, dizziness, severe headache, change in vision Sudden or severe stomach pain, nausea, vomiting, fever, or bloody diarrhea Vision loss Side effects that usually do not require medical attention (report to your care team if they continue or are bothersome): Dizziness General discomfort or fatigue This list may not describe all possible side effects. Call your doctor for medical advice about side effects. You may report side effects to FDA at 1-800-FDA-1088. Where should I keep my medication? Keep out of the reach of children and pets. Store at room temperature between 15 and 30 degrees C (59 and 86 degrees F). Throw away any unused medication after the expiration date. NOTE: This sheet is a summary. It may not cover all possible information. If you have questions about this medicine, talk to your doctor, pharmacist, or health care provider.  2024 Elsevier/Gold Standard (2021-06-04 00:00:00)Propranolol Tablets What is this medication? PROPRANOLOL (proe PRAN oh lole) treats many conditions such as high blood pressure, tremors, and a type of arrhythmia known as AFib (atrial fibrillation). It works by lowering your blood pressure and heart rate, making it easier for your heart to pump blood to the rest of your body. It may be used to prevent migraine headaches.  It works by relaxing the blood vessels in the brain that cause migraines. It belongs to a group of medications called beta blockers. This medicine may be used for other purposes; ask your health care provider or pharmacist if you have questions. COMMON BRAND NAME(S): Inderal What should  I tell my care team before I take this medication? They need to know if you have any of these conditions: Diabetes Having surgery Heart or blood vessel conditions, such as slow heartbeat, heart failure, heart block Kidney disease Liver disease Lung or breathing disease, such as asthma or COPD Myasthenia gravis Pheochromocytoma Thyroid  disease An unusual or allergic reaction to propranolol, other medications, foods, dyes, or preservatives Pregnant or trying to get pregnant Breastfeeding How should I use this medication? Take this medication by mouth. Take it as directed on the prescription label at the same time every day. Keep taking it unless your care team tells you to stop. Talk to your care team about the use of this medication in children. Special care may be needed. Overdosage: If you think you have taken too much of this medicine contact a poison control center or emergency room at once. NOTE: This medicine is only for you. Do not share this medicine with others. What if I miss a dose? If you miss a dose, take it as soon as you can. If it is almost time for your next dose, take only that dose. Do not take double or extra doses. What may interact with this medication? Do not take this medication with any of the following: Thioridazine This medication may also interact with the following: Certain medications for blood pressure, heart disease, irregular heartbeat Epinephrine NSAIDs, medications for pain and inflammation, such as ibuprofen  or naproxen Warfarin Other medications may affect the way this medication works. Talk with your care team about all of the medications you take. They may suggest changes to your treatment plan to lower the risk of side effects and to make sure your medications work as intended. This list may not describe all possible interactions. Give your health care provider a list of all the medicines, herbs, non-prescription drugs, or dietary supplements you  use. Also tell them if you smoke, drink alcohol, or use illegal drugs. Some items may interact with your medicine. What should I watch for while using this medication? Visit your care team for regular checks on your progress. Check your blood pressure as directed. Know what your blood pressure should be and when to contact your care team. This medication may affect your coordination, reaction time, or judgment. Do not drive or operate machinery until you know how this medication affects you. Sit up or stand slowly to reduce the risk of dizzy or fainting spells. Drinking alcohol with this medication can increase the risk of these side effects. Do not suddenly stop taking this medication. This may increase your risk of side effects, such as chest pain and heart attack. If you no longer need to take this medication, your care team will lower the dose slowly over time to decrease the risk of side effects. If you are going to need surgery or a procedure, tell your care team that you are using this medication. This medication may affect blood glucose levels. It can also mask the symptoms of low blood sugar, such as a rapid heartbeat and tremors. If you have diabetes, it is important to check your blood sugar often while you are taking this medication. Do not treat yourself for coughs, colds, or  pain while you are using this medication without asking your care team for advice. Some medications may increase your blood pressure. What side effects may I notice from receiving this medication? Side effects that you should report to your care team as soon as possible: Allergic reactions--skin rash, itching, hives, swelling of the face, lips, tongue, or throat Heart failure--shortness of breath, swelling of the ankles, feet, or hands, sudden weight gain, unusual weakness or fatigue Low blood pressure--dizziness, feeling faint or lightheaded, blurry vision Raynaud's--cool, numb, or painful fingers or toes that may  change color from pale, to blue, to red Redness, blistering, peeling, or loosening of the skin, including inside the mouth Slow heartbeat--dizziness, feeling faint or lightheaded, confusion, trouble breathing, unusual weakness or fatigue Worsening mood, feelings of depression Side effects that usually do not require medical attention (report to your care team if they continue or are bothersome): Change in sex drive or performance Diarrhea Dizziness Fatigue Headache This list may not describe all possible side effects. Call your doctor for medical advice about side effects. You may report side effects to FDA at 1-800-FDA-1088. Where should I keep my medication? Keep out of the reach of children and pets. Store at room temperature between 20 and 25 degrees C (68 and 77 degrees F). Protect from light. Throw away any unused medication after the expiration date. NOTE: This sheet is a summary. It may not cover all possible information. If you have questions about this medicine, talk to your doctor, pharmacist, or health care provider.  2024 Elsevier/Gold Standard (2022-01-03 00:00:00)

## 2023-07-03 ENCOUNTER — Encounter: Payer: Self-pay | Admitting: Neurology

## 2023-07-03 ENCOUNTER — Telehealth: Payer: Self-pay | Admitting: Neurology

## 2023-07-03 ENCOUNTER — Encounter: Payer: Self-pay | Admitting: Certified Nurse Midwife

## 2023-07-03 NOTE — Telephone Encounter (Signed)
 no auth required sent to [facility] [phone]

## 2023-07-04 ENCOUNTER — Other Ambulatory Visit (HOSPITAL_COMMUNITY): Payer: Self-pay

## 2023-07-04 ENCOUNTER — Telehealth: Payer: Self-pay

## 2023-07-04 ENCOUNTER — Other Ambulatory Visit: Payer: Self-pay | Admitting: Certified Nurse Midwife

## 2023-07-04 MED ORDER — NORETHINDRONE 0.35 MG PO TABS: 1.0000 | ORAL_TABLET | Freq: Every day | ORAL | 4 refills | Status: AC

## 2023-07-04 NOTE — Progress Notes (Signed)
 COC changed to progestin only pill at recommendation of neurologist due to chronic migraines.

## 2023-07-04 NOTE — Telephone Encounter (Signed)
 Medicaid rules are a PT must try and fail the two preferred drugs which are Rizatriptan and Sumatriptan . I see PT has tried Rizatriptan but not Sumatriptan . Please advise-Thanks.

## 2023-07-05 NOTE — Telephone Encounter (Signed)
 Rizatriptan is contraindicated with propranolol, I will put that in my note thanks

## 2023-07-06 ENCOUNTER — Other Ambulatory Visit (HOSPITAL_COMMUNITY): Payer: Self-pay

## 2023-07-06 NOTE — Telephone Encounter (Signed)
 Pharmacy Patient Advocate Encounter   Received notification from Physician's Office that prior authorization for Eletriptan 40mg  tablet is required/requested.   Insurance verification completed.   The patient is insured through [facility] MEDICAID .   Per test claim: PA required; PA submitted to above mentioned insurance via [facility] Tr[facility] Key/confirmation #/EOC [id number] W Status is pending

## 2023-07-10 NOTE — Telephone Encounter (Addendum)
 Pharmacy Patient Advocate Encounter  Received notification from [place] [employer]ICA[employer] that Prior Authorization for Relpax  has been DENIED.  No reason given; No denial letter received via Fax or CMM. It has been requested and will be uploaded to the media tab once received.   PA #/Case ID/Reference #: [id number] W  I will outreach insurance to obtain denial reason/letter.

## 2023-07-14 ENCOUNTER — Ambulatory Visit
Admission: RE | Admit: 2023-07-14 | Discharge: 2023-07-14 | Disposition: A | Discharge status: Home / Self Care | Source: Ambulatory Visit | Attending: Neurology | Admitting: Neurology

## 2023-07-14 DIAGNOSIS — R519 Headache, unspecified: Secondary | ICD-10-CM | POA: Diagnosis not present

## 2023-07-14 DIAGNOSIS — R51 Headache with orthostatic component, not elsewhere classified: Secondary | ICD-10-CM

## 2023-07-14 DIAGNOSIS — H539 Unspecified visual disturbance: Secondary | ICD-10-CM

## 2023-07-14 MED ORDER — GADOPICLENOL 0.5 MMOL/ML IV SOLN
10.0000 mL | Freq: Once | INTRAVENOUS | Status: AC | PRN
  Administered 2023-07-14: 10 mL via INTRAVENOUS

## 2023-07-17 ENCOUNTER — Ambulatory Visit: Payer: Self-pay | Admitting: Neurology

## 2023-07-24 NOTE — Telephone Encounter (Signed)
 Denied due to PT must try and fail two preferred Triptans (Rizatriptan, Sumatriptan ). There was no space on PA in CMM to include the contraindication for rizatriptan and propranolol . I asked for a copy of the denial letter to be faxed but they told me a copy was mailed to [facility] on 07/10/2023, I verified mailing address. [employer] should physically have the denial letter, also appeal information in not on the denial letter that [place] [employer] sends to the providers office. Appeal info is only included in the denial letter they send to the PT themselves and PT is to initiate an appeals and then [facility] will outreach us .   Call reference: [id number]

## 2023-07-25 NOTE — Telephone Encounter (Signed)
 I called pt and relayed about the denial for the eletriptan .  She did receive the letter.  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  She appreciated call back nothing more to be done.

## 2023-07-31 ENCOUNTER — Ambulatory Visit: Admitting: Orthopedic Surgery

## 2023-08-08 ENCOUNTER — Institutional Professional Consult (permissible substitution): Admitting: Neurology

## 2023-08-10 ENCOUNTER — Other Ambulatory Visit: Payer: Self-pay | Admitting: Certified Nurse Midwife

## 2023-09-26 ENCOUNTER — Other Ambulatory Visit: Payer: Self-pay | Admitting: Podiatry

## 2023-09-26 DIAGNOSIS — M722 Plantar fascial fibromatosis: Secondary | ICD-10-CM

## 2023-11-20 ENCOUNTER — Encounter: Payer: Self-pay | Admitting: Radiology

## 2023-11-27 ENCOUNTER — Telehealth: Admitting: Neurology

## 2023-12-11 ENCOUNTER — Other Ambulatory Visit: Payer: Self-pay | Admitting: Certified Nurse Midwife

## 2023-12-13 ENCOUNTER — Telehealth: Payer: Self-pay | Admitting: Neurology

## 2023-12-13 NOTE — Telephone Encounter (Signed)
 Pt cx appointment due to no insurance at this time

## 2023-12-16 ENCOUNTER — Other Ambulatory Visit: Payer: Self-pay | Admitting: Certified Nurse Midwife

## 2023-12-16 MED ORDER — ESOMEPRAZOLE MAGNESIUM 40 MG PO CPDR: 40.0000 mg | DELAYED_RELEASE_CAPSULE | Freq: Two times a day (BID) | ORAL | 1 refills | Status: AC

## 2023-12-20 ENCOUNTER — Ambulatory Visit: Payer: Self-pay | Admitting: Neurology

## 2024-01-03 ENCOUNTER — Other Ambulatory Visit: Payer: Self-pay | Admitting: Certified Nurse Midwife

## 2024-01-03 ENCOUNTER — Encounter: Payer: Self-pay | Admitting: Certified Nurse Midwife

## 2024-01-03 MED ORDER — OSELTAMIVIR PHOSPHATE 75 MG PO CAPS: 75.0000 mg | ORAL_CAPSULE | Freq: Every day | ORAL | 0 refills | Status: AC

## 2024-01-03 NOTE — Progress Notes (Signed)
 Patient with exposure to influenza, Tamiflu  to pharmacy of choice.

## 2024-01-17 ENCOUNTER — Other Ambulatory Visit: Payer: Self-pay

## 2024-01-17 DIAGNOSIS — G43709 Chronic migraine without aura, not intractable, without status migrainosus: Secondary | ICD-10-CM

## 2024-01-17 MED ORDER — PROPRANOLOL HCL 10 MG PO TABS: 10.0000 mg | ORAL_TABLET | Freq: Two times a day (BID) | ORAL | 6 refills | Status: AC
